# Patient Record
Sex: Male | Born: 1955 | Race: White | Hispanic: No | Marital: Married | Smoking: Never smoker
Health system: Southern US, Community
[De-identification: ages and names within clinical notes are randomized; demographics above are authoritative.]

## PROBLEM LIST (undated history)

## (undated) DIAGNOSIS — N529 Male erectile dysfunction, unspecified: Secondary | ICD-10-CM

## (undated) DIAGNOSIS — I509 Heart failure, unspecified: Secondary | ICD-10-CM

## (undated) DIAGNOSIS — J310 Chronic rhinitis: Secondary | ICD-10-CM

## (undated) DIAGNOSIS — K219 Gastro-esophageal reflux disease without esophagitis: Secondary | ICD-10-CM

## (undated) DIAGNOSIS — F32A Depression, unspecified: Secondary | ICD-10-CM

## (undated) DIAGNOSIS — I10 Essential (primary) hypertension: Secondary | ICD-10-CM

## (undated) DIAGNOSIS — C61 Malignant neoplasm of prostate: Secondary | ICD-10-CM

## (undated) DIAGNOSIS — F419 Anxiety disorder, unspecified: Secondary | ICD-10-CM

## (undated) HISTORY — DX: Gastro-esophageal reflux disease without esophagitis: K21.9

## (undated) HISTORY — DX: Malignant neoplasm of prostate: C61

## (undated) HISTORY — DX: Heart failure, unspecified: I50.9

## (undated) HISTORY — DX: Male erectile dysfunction, unspecified: N52.9

## (undated) HISTORY — DX: Depression, unspecified: F32.A

---

## 2014-11-17 HISTORY — PX: PROSTATECTOMY: SHX69

## 2021-02-20 DIAGNOSIS — I361 Nonrheumatic tricuspid (valve) insufficiency: Secondary | ICD-10-CM | POA: Diagnosis not present

## 2021-02-20 DIAGNOSIS — I509 Heart failure, unspecified: Secondary | ICD-10-CM | POA: Diagnosis not present

## 2021-02-20 DIAGNOSIS — I340 Nonrheumatic mitral (valve) insufficiency: Secondary | ICD-10-CM | POA: Diagnosis not present

## 2021-02-21 DIAGNOSIS — I509 Heart failure, unspecified: Secondary | ICD-10-CM | POA: Diagnosis not present

## 2021-02-21 DIAGNOSIS — I10 Essential (primary) hypertension: Secondary | ICD-10-CM | POA: Diagnosis not present

## 2021-02-21 DIAGNOSIS — I720 Aneurysm of carotid artery: Secondary | ICD-10-CM | POA: Diagnosis not present

## 2021-02-22 ENCOUNTER — Encounter (HOSPITAL_COMMUNITY)
Admission: EM | Disposition: A | Discharge status: Home / Self Care | Payer: Self-pay | Source: Other Acute Inpatient Hospital | Attending: Internal Medicine

## 2021-02-22 ENCOUNTER — Inpatient Hospital Stay (HOSPITAL_COMMUNITY)
Admission: EM | Admit: 2021-02-22 | Discharge: 2021-02-25 | DRG: 286 | Disposition: A | Discharge status: Home / Self Care | Payer: Medicare Other | Source: Other Acute Inpatient Hospital | Attending: Internal Medicine | Admitting: Internal Medicine

## 2021-02-22 ENCOUNTER — Encounter (HOSPITAL_COMMUNITY): Payer: Self-pay | Admitting: Internal Medicine

## 2021-02-22 DIAGNOSIS — F32A Depression, unspecified: Secondary | ICD-10-CM | POA: Diagnosis present

## 2021-02-22 DIAGNOSIS — Z8249 Family history of ischemic heart disease and other diseases of the circulatory system: Secondary | ICD-10-CM | POA: Diagnosis not present

## 2021-02-22 DIAGNOSIS — I428 Other cardiomyopathies: Secondary | ICD-10-CM | POA: Diagnosis present

## 2021-02-22 DIAGNOSIS — Z20822 Contact with and (suspected) exposure to covid-19: Secondary | ICD-10-CM | POA: Diagnosis present

## 2021-02-22 DIAGNOSIS — J310 Chronic rhinitis: Secondary | ICD-10-CM | POA: Diagnosis present

## 2021-02-22 DIAGNOSIS — Z79899 Other long term (current) drug therapy: Secondary | ICD-10-CM | POA: Diagnosis not present

## 2021-02-22 DIAGNOSIS — F419 Anxiety disorder, unspecified: Secondary | ICD-10-CM | POA: Diagnosis present

## 2021-02-22 DIAGNOSIS — I2510 Atherosclerotic heart disease of native coronary artery without angina pectoris: Secondary | ICD-10-CM

## 2021-02-22 DIAGNOSIS — I493 Ventricular premature depolarization: Secondary | ICD-10-CM | POA: Diagnosis present

## 2021-02-22 DIAGNOSIS — I110 Hypertensive heart disease with heart failure: Secondary | ICD-10-CM | POA: Diagnosis present

## 2021-02-22 DIAGNOSIS — Z8546 Personal history of malignant neoplasm of prostate: Secondary | ICD-10-CM | POA: Diagnosis not present

## 2021-02-22 DIAGNOSIS — I10 Essential (primary) hypertension: Secondary | ICD-10-CM

## 2021-02-22 DIAGNOSIS — I5021 Acute systolic (congestive) heart failure: Secondary | ICD-10-CM | POA: Diagnosis present

## 2021-02-22 DIAGNOSIS — R000 Tachycardia, unspecified: Secondary | ICD-10-CM

## 2021-02-22 HISTORY — DX: Essential (primary) hypertension: I10

## 2021-02-22 HISTORY — DX: Chronic rhinitis: J31.0

## 2021-02-22 HISTORY — DX: Anxiety disorder, unspecified: F41.9

## 2021-02-22 HISTORY — PX: RIGHT/LEFT HEART CATH AND CORONARY ANGIOGRAPHY: CATH118266

## 2021-02-22 LAB — POCT I-STAT EG7
Acid-Base Excess: 1 mmol/L (ref 0.0–2.0)
Acid-Base Excess: 2 mmol/L (ref 0.0–2.0)
Acid-Base Excess: 2 mmol/L (ref 0.0–2.0)
Bicarbonate: 25.9 mmol/L (ref 20.0–28.0)
Bicarbonate: 26.5 mmol/L (ref 20.0–28.0)
Bicarbonate: 26.5 mmol/L (ref 20.0–28.0)
Calcium, Ion: 1.1 mmol/L — ABNORMAL LOW (ref 1.15–1.40)
Calcium, Ion: 1.12 mmol/L — ABNORMAL LOW (ref 1.15–1.40)
Calcium, Ion: 1.15 mmol/L (ref 1.15–1.40)
HCT: 48 % (ref 39.0–52.0)
HCT: 48 % (ref 39.0–52.0)
HCT: 49 % (ref 39.0–52.0)
Hemoglobin: 16.3 g/dL (ref 13.0–17.0)
Hemoglobin: 16.3 g/dL (ref 13.0–17.0)
Hemoglobin: 16.7 g/dL (ref 13.0–17.0)
O2 Saturation: 74 %
O2 Saturation: 75 %
O2 Saturation: 81 %
Potassium: 3.6 mmol/L (ref 3.5–5.1)
Potassium: 3.7 mmol/L (ref 3.5–5.1)
Potassium: 3.7 mmol/L (ref 3.5–5.1)
Sodium: 136 mmol/L (ref 135–145)
Sodium: 137 mmol/L (ref 135–145)
Sodium: 137 mmol/L (ref 135–145)
TCO2: 27 mmol/L (ref 22–32)
TCO2: 28 mmol/L (ref 22–32)
TCO2: 28 mmol/L (ref 22–32)
pCO2, Ven: 41 mmHg — ABNORMAL LOW (ref 44.0–60.0)
pCO2, Ven: 41.2 mmHg — ABNORMAL LOW (ref 44.0–60.0)
pCO2, Ven: 41.5 mmHg — ABNORMAL LOW (ref 44.0–60.0)
pH, Ven: 7.408 (ref 7.250–7.430)
pH, Ven: 7.413 (ref 7.250–7.430)
pH, Ven: 7.417 (ref 7.250–7.430)
pO2, Ven: 39 mmHg (ref 32.0–45.0)
pO2, Ven: 39 mmHg (ref 32.0–45.0)
pO2, Ven: 45 mmHg (ref 32.0–45.0)

## 2021-02-22 LAB — POCT I-STAT 7, (LYTES, BLD GAS, ICA,H+H)
Acid-base deficit: 1 mmol/L (ref 0.0–2.0)
Bicarbonate: 21.9 mmol/L (ref 20.0–28.0)
Calcium, Ion: 0.91 mmol/L — ABNORMAL LOW (ref 1.15–1.40)
HCT: 43 % (ref 39.0–52.0)
Hemoglobin: 14.6 g/dL (ref 13.0–17.0)
O2 Saturation: 99 %
Potassium: 3.1 mmol/L — ABNORMAL LOW (ref 3.5–5.1)
Sodium: 142 mmol/L (ref 135–145)
TCO2: 23 mmol/L (ref 22–32)
pCO2 arterial: 32.2 mmHg (ref 32.0–48.0)
pH, Arterial: 7.441 (ref 7.350–7.450)
pO2, Arterial: 109 mmHg — ABNORMAL HIGH (ref 83.0–108.0)

## 2021-02-22 SURGERY — RIGHT/LEFT HEART CATH AND CORONARY ANGIOGRAPHY
Anesthesia: LOCAL

## 2021-02-22 MED ORDER — ASPIRIN 81 MG PO CHEW: 81.0000 mg | CHEWABLE_TABLET | ORAL | Status: DC

## 2021-02-22 MED ORDER — ROSUVASTATIN CALCIUM 5 MG PO TABS
10.0000 mg | ORAL_TABLET | Freq: Every day | ORAL | Status: DC
  Administered 2021-02-23 – 2021-02-25 (×3): 10 mg via ORAL
  Filled 2021-02-22 (×3): qty 2

## 2021-02-22 MED ORDER — LIDOCAINE HCL (PF) 1 % IJ SOLN
INTRAMUSCULAR | Status: AC
  Filled 2021-02-22: qty 30

## 2021-02-22 MED ORDER — HEPARIN SODIUM (PORCINE) 1000 UNIT/ML IJ SOLN
INTRAMUSCULAR | Status: AC
  Filled 2021-02-22: qty 1

## 2021-02-22 MED ORDER — SODIUM CHLORIDE 0.9 % IV SOLN: INTRAVENOUS | Status: DC

## 2021-02-22 MED ORDER — FENTANYL CITRATE (PF) 100 MCG/2ML IJ SOLN
INTRAMUSCULAR | Status: AC
  Filled 2021-02-22: qty 2

## 2021-02-22 MED ORDER — ENOXAPARIN SODIUM 40 MG/0.4ML ~~LOC~~ SOLN
40.0000 mg | SUBCUTANEOUS | Status: DC
  Administered 2021-02-23 – 2021-02-25 (×3): 40 mg via SUBCUTANEOUS
  Filled 2021-02-22 (×3): qty 0.4

## 2021-02-22 MED ORDER — MIDAZOLAM HCL 2 MG/2ML IJ SOLN
INTRAMUSCULAR | Status: DC | PRN
  Administered 2021-02-22: 1 mg via INTRAVENOUS

## 2021-02-22 MED ORDER — MIDAZOLAM HCL 2 MG/2ML IJ SOLN
INTRAMUSCULAR | Status: AC
  Filled 2021-02-22: qty 2

## 2021-02-22 MED ORDER — HEPARIN SODIUM (PORCINE) 1000 UNIT/ML IJ SOLN
INTRAMUSCULAR | Status: DC | PRN
  Administered 2021-02-22: 5000 [IU] via INTRAVENOUS

## 2021-02-22 MED ORDER — HEPARIN (PORCINE) IN NACL 1000-0.9 UT/500ML-% IV SOLN
INTRAVENOUS | Status: AC
  Filled 2021-02-22: qty 1000

## 2021-02-22 MED ORDER — SODIUM CHLORIDE 0.9% FLUSH
3.0000 mL | Freq: Two times a day (BID) | INTRAVENOUS | Status: DC
  Administered 2021-02-22 – 2021-02-25 (×6): 3 mL via INTRAVENOUS

## 2021-02-22 MED ORDER — SODIUM CHLORIDE 0.9 % IV SOLN: 250.0000 mL | INTRAVENOUS | Status: DC | PRN

## 2021-02-22 MED ORDER — VERAPAMIL HCL 2.5 MG/ML IV SOLN
INTRAVENOUS | Status: DC | PRN
  Administered 2021-02-22: 10 mL via INTRA_ARTERIAL

## 2021-02-22 MED ORDER — IOHEXOL 350 MG/ML SOLN
INTRAVENOUS | Status: DC | PRN
  Administered 2021-02-22: 40 mL

## 2021-02-22 MED ORDER — FENTANYL CITRATE (PF) 100 MCG/2ML IJ SOLN
INTRAMUSCULAR | Status: DC | PRN
  Administered 2021-02-22: 25 ug via INTRAVENOUS

## 2021-02-22 MED ORDER — HEPARIN (PORCINE) IN NACL 1000-0.9 UT/500ML-% IV SOLN
INTRAVENOUS | Status: DC | PRN
  Administered 2021-02-22 (×2): 500 mL

## 2021-02-22 MED ORDER — SPIRONOLACTONE 12.5 MG HALF TABLET
12.5000 mg | ORAL_TABLET | Freq: Every day | ORAL | Status: DC
  Administered 2021-02-22 – 2021-02-25 (×4): 12.5 mg via ORAL
  Filled 2021-02-22 (×4): qty 1

## 2021-02-22 MED ORDER — DIGOXIN 125 MCG PO TABS
0.1250 mg | ORAL_TABLET | Freq: Every day | ORAL | Status: DC
  Administered 2021-02-22 – 2021-02-25 (×4): 0.125 mg via ORAL
  Filled 2021-02-22 (×4): qty 1

## 2021-02-22 MED ORDER — SODIUM CHLORIDE 0.9% FLUSH: 3.0000 mL | INTRAVENOUS | Status: DC | PRN

## 2021-02-22 MED ORDER — VERAPAMIL HCL 2.5 MG/ML IV SOLN
INTRAVENOUS | Status: AC
  Filled 2021-02-22: qty 2

## 2021-02-22 MED ORDER — LIDOCAINE HCL (PF) 1 % IJ SOLN
INTRAMUSCULAR | Status: DC | PRN
  Administered 2021-02-22 (×2): 2 mL

## 2021-02-22 SURGICAL SUPPLY — 10 items
CATH 5FR JL3.5 JR4 ANG PIG MP (CATHETERS) ×2 IMPLANT
CATH SWAN GANZ 7F STRAIGHT (CATHETERS) ×2 IMPLANT
DEVICE RAD COMP TR BAND LRG (VASCULAR PRODUCTS) ×2 IMPLANT
GLIDESHEATH SLEND SS 6F .021 (SHEATH) ×2 IMPLANT
GLIDESHEATH SLENDER 7FR .021G (SHEATH) ×2 IMPLANT
GUIDEWIRE INQWIRE 1.5J.035X260 (WIRE) ×1 IMPLANT
INQWIRE 1.5J .035X260CM (WIRE) ×2
KIT HEART LEFT (KITS) ×2 IMPLANT
PACK CARDIAC CATHETERIZATION (CUSTOM PROCEDURE TRAY) ×2 IMPLANT
TRANSDUCER W/STOPCOCK (MISCELLANEOUS) ×2 IMPLANT

## 2021-02-22 NOTE — Interval H&P Note (Signed)
History and Physical Interval Note:  02/22/2021 2:12 PM  Neil Weber  has presented today for surgery, with the diagnosis of acute systolic HF.  The various methods of treatment have been discussed with the patient and family. After consideration of risks, benefits and other options for treatment, the patient has consented to  Procedure(s): RIGHT/LEFT HEART CATH AND CORONARY ANGIOGRAPHY (N/A) and possible coronary angioplasty as a surgical intervention.  The patient's history has been reviewed, patient examined, no change in status, stable for surgery.  I have reviewed the patient's chart and labs.  Questions were answered to the patient's satisfaction.     Madisan Bice

## 2021-02-22 NOTE — Progress Notes (Signed)
PT ARRIVED [facility]. PT ALERT AND ORIENTED X'S 4. PT IS PAIN FREE, DENIES SOB. PT HAS IV SALINE LOCK TO RIGHT AC.

## 2021-02-22 NOTE — H&P (Signed)
Advanced Heart Failure Team History and Physical Note   PCP:  No primary care provider on file.  PCP-Cardiology: No primary care provider on file.     Reason for Admission: Acute Systolic Heart Failure.    HPI:   Mr Halling is a 65 year old with history of anxiety, depression, and prostate cancer. No family history of coronary disease.   ON 4/6 he went to PCP due to worsening shortness of breath which was new for him. He was sent to [facility] for evaluation. He first noticed some shortness of breath earlier this week and thought that it was from allergies.  No recent virus. Had CTA which was negative for PE and showed moderate bilateral pleural effusions. EKG on admit with ST at 131 bpm. Started on IV lasix and brisk diuresis. Weight down 3 pounds in 24 hours.  ECHO completed and showed reduced LVEF 20-25%. Cardiology consulted with recommendations to transfer to [facility].   Labs 02/20/21 - creatinine 1.2 K 4.3 TSH `.65  Trop < 0.04 UDS negative  Respiratory Pane neg for flu and SARS 2 Labs 02/21/21- creatinine 1.1, K 4, Hgb 14.5  Labs 02/22/21- Creatinine 1.3   Transferred to [facility] for diagnostic cath. Denies chest pain.    FH: No known family history for coronary disease.  SH: He does not drink alcohol or smoke. 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 Review of Systems: [y] = yes, [ ]  = no   . General: Weight gain [ ] ; Weight loss [ ] ; Anorexia [ ] ; Fatigue [Y ]; Fever [ ] ; Chills [ ] ; Weakness [ ]   . Cardiac: Chest pain/pressure [ ] ; Resting SOB [ ] ; Exertional SOB [Y ]; Orthopnea [ ] ; Pedal Edema [ ] ; Palpitations [ ] ; Syncope [ ] ; Presyncope [ ] ; Paroxysmal nocturnal dyspnea[ ]   . Pulmonary: Cough [ ] ; Wheezing[ ] ; Hemoptysis[ ] ; Sputum [ ] ; Snoring [ ]   . GI: Vomiting[ ] ; Dysphagia[ ] ; Melena[ ] ; Hematochezia [ ] ; Heartburn[ ] ; Abdominal pain [ ] ; Constipation [ ] ; Diarrhea [ ] ; BRBPR [ ]   . GU: Hematuria[ ] ; Dysuria [ ] ; Nocturia[ ]    . Vascular: Pain in legs with walking [ ] ; Pain in feet with lying flat [ ] ; Non-healing sores [ ] ; Stroke [ ] ; TIA [ ] ; Slurred speech [ ] ;  . Neuro: Headaches[ ] ; Vertigo[ ] ; Seizures[ ] ; Paresthesias[ ] ;Blurred vision [ ] ; Diplopia [ ] ; Vision changes [ ]   . Ortho/Skin: Arthritis [ ] ; Joint pain [ ] ; Muscle pain [ ] ; Joint swelling [ ] ; Back Pain [ ] ; Rash [ ]   . Psych: Depression[ ] ; Anxiety[Y ]  . Heme: Bleeding problems [ ] ; Clotting disorders [ ] ; Anemia [ ]   . Endocrine: Diabetes [ ] ; Thyroid dysfunction[ ]    Home Medications Prior to Admission medications   Not on File    Past Medical History: Past Medical History:  Diagnosis Date  . Anxiety   . Hypertension   . Rhinitis     Past Surgical History:  Family History:  Family History  Problem Relation Age of Onset  . Hypertension Father     Social History: Social History   Socioeconomic History  . Marital status: Married    Spouse name: Not on file  . Number of children: Not on file  . Years of education: Not on file  . Highest education level: Not on file  Occupational History    Employer: [employer]  Comment: Full Time   Tobacco Use  . Smoking status: Never Smoker  . Smokeless tobacco: Never Used  Substance and Sexual Activity  . Alcohol use: Not Currently  . Drug use: Never  . Sexual activity: Not on file  Other Topics Concern  . Not on file  Social History Narrative  . Not on file   Social Determinants of Health   Financial Resource Strain: Not on file  Food Insecurity: Not on file  Transportation Needs: Not on file  Physical Activity: Not on file  Stress: Not on file  Social Connections: Not on file    Allergies:  No Known Allergies  Objective:    Vital Signs:   Weight:  [105 kg] 105 kg (04/08 1400)   Filed Weights   02/22/21 1400  Weight: 105 kg     Physical Exam     General:   No respiratory difficulty HEENT: Normal Neck: Supple. no JVD. Carotids 2+ bilat; no bruits.  No lymphadenopathy or thyromegaly appreciated. Cor: PMI nondisplaced. Regular rate & rhythm. No rubs, gallops or murmurs. Lungs: Clear Abdomen: Soft, nontender, nondistended. No hepatosplenomegaly. No bruits or masses. Good bowel sounds. Extremities: No cyanosis, clubbing, rash, edema Neuro: Alert & oriented x 3, cranial nerves grossly intact. moves all 4 extremities w/o difficulty. Affect pleasant.   Telemetry   Sinus Tach 100s   EKG  N/a   Labs     Basic Metabolic Panel: No results for input(s): NA, K, CL, CO2, GLUCOSE, BUN, CREATININE, CALCIUM, MG, PHOS in the last 168 hours.  Liver Function Tests: No results for input(s): AST, ALT, ALKPHOS, BILITOT, PROT, ALBUMIN in the last 168 hours. No results for input(s): LIPASE, AMYLASE in the last 168 hours. No results for input(s): AMMONIA in the last 168 hours.  CBC: No results for input(s): WBC, NEUTROABS, HGB, HCT, MCV, PLT in the last 168 hours.  Cardiac Enzymes: No results for input(s): CKTOTAL, CKMB, CKMBINDEX, TROPONINI in the last 168 hours.  BNP: BNP (last 3 results) No results for input(s): BNP in the last 8760 hours.  ProBNP (last 3 results) No results for input(s): PROBNP in the last 8760 hours.   CBG: No results for input(s): GLUCAP in the last 168 hours.  Coagulation Studies: No results for input(s): LABPROT, INR in the last 72 hours.  Imaging: No results found.     Assessment/Plan   1. Acute Systolic Heart Failure ECHO at [facility] EF 20-25% RV Mildly reduced. Unknown etiology. Hypertensive on admit. Troponin < 0.04. Diagnostic cath today to further assess.  Creatinine today 1.3  - If coronaries ok will need CMRI . -Adjust HF meds post cath.  - Renal function has been stable.   2. HTN  Hypertensive at [facility].  Follow closely.   3. Sinus Tach  Possible low output put. 4/6 TSH 1.65   Darrick Grinder, NP 02/22/2021, 2:16 PM  Advanced Heart Failure Team Pager [phone] (M-F; 7a - 5p)   Please contact [facility] for night-coverage after hours (4p -7a ) and weekends on [email or web address]  Patient seen and examined with the above-signed Advanced Practice Provider and/or Housestaff. I personally reviewed laboratory data, imaging studies and relevant notes. I independently examined the patient and formulated the important aspects of the plan. I have edited the note to reflect any of my changes or salient points. I have personally discussed the plan with the patient and/or family.  65 y/o male with h/o anxiety and depression admitted to [place] with new onset HF  Echo EF 25%. Has been diuresed and feels better.   General:  Well appearing. No resp difficulty HEENT: normal Neck: supple. no JVD. Carotids 2+ bilat; no bruits. No lymphadenopathy or thryomegaly appreciated. Cor: PMI nondisplaced. Tachy regular  +s3 Lungs: clear Abdomen: soft, nontender, nondistended. No hepatosplenomegaly. No bruits or masses. Good bowel sounds. Extremities: no cyanosis, clubbing, rash, edema Neuro: alert & orientedx3, cranial nerves grossly intact. moves all 4 extremities w/o difficulty. Affect pleasant  New onset systolic HF in patient with no significan CV risk factors. Suspect NICM. Plan R/: cath today followed by cMRI. Titrate GDMT.   Glori Bickers, MD  2:23 PM

## 2021-02-22 NOTE — H&P (Addendum)
Advanced Heart Failure Team History and Physical Note   PCP:  No primary care provider on file.  PCP-Cardiology: No primary care provider on file.     Reason for Admission: Acute Systolic Heart Failure.    HPI:   Neil Weber is a 65 year old with history of anxiety, depression, and prostate cancer. No family history of coronary disease.   ON 4/6 he went to PCP due to worsening shortness of breath which was new for him. He was sent to [facility] for evaluation. He first noticed some shortness of breath earlier this week and thought that it was from allergies.  No recent virus. Had CTA which was negative for PE and showed moderate bilateral pleural effusions. EKG on admit with ST at 131 bpm. Started on IV lasix and brisk diuresis. Weight down 3 pounds in 24 hours.  ECHO completed and showed reduced LVEF 20-25%. Cardiology consulted with recommendations to transfer to [facility].   Labs 02/20/21 - creatinine 1.2 K 4.3 TSH `.65  Trop < 0.04 UDS negative  Respiratory Pane neg for flu and SARS 2 Labs 02/21/21- creatinine 1.1, K 4, Hgb 14.5  Labs 02/22/21- Creatinine 1.3   Transferred to [facility] for diagnostic cath. Denies chest pain.    FH: No known family history for coronary disease.  SH: He does not drink alcohol or smoke. 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 Review of Systems: [y] = yes, [ ]  = no   . General: Weight gain [ ] ; Weight loss [ ] ; Anorexia [ ] ; Fatigue [Y ]; Fever [ ] ; Chills [ ] ; Weakness [ ]   . Cardiac: Chest pain/pressure [ ] ; Resting SOB [ ] ; Exertional SOB [Y ]; Orthopnea [ ] ; Pedal Edema [ ] ; Palpitations [ ] ; Syncope [ ] ; Presyncope [ ] ; Paroxysmal nocturnal dyspnea[ ]   . Pulmonary: Cough [ ] ; Wheezing[ ] ; Hemoptysis[ ] ; Sputum [ ] ; Snoring [ ]   . GI: Vomiting[ ] ; Dysphagia[ ] ; Melena[ ] ; Hematochezia [ ] ; Heartburn[ ] ; Abdominal pain [ ] ; Constipation [ ] ; Diarrhea [ ] ; BRBPR [ ]   . GU: Hematuria[ ] ; Dysuria [ ] ; Nocturia[ ]    . Vascular: Pain in legs with walking [ ] ; Pain in feet with lying flat [ ] ; Non-healing sores [ ] ; Stroke [ ] ; TIA [ ] ; Slurred speech [ ] ;  . Neuro: Headaches[ ] ; Vertigo[ ] ; Seizures[ ] ; Paresthesias[ ] ;Blurred vision [ ] ; Diplopia [ ] ; Vision changes [ ]   . Ortho/Skin: Arthritis [ ] ; Joint pain [ ] ; Muscle pain [ ] ; Joint swelling [ ] ; Back Pain [ ] ; Rash [ ]   . Psych: Depression[ ] ; Anxiety[Y ]  . Heme: Bleeding problems [ ] ; Clotting disorders [ ] ; Anemia [ ]   . Endocrine: Diabetes [ ] ; Thyroid dysfunction[ ]    Home Medications Prior to Admission medications   Not on File    Past Medical History: Past Medical History:  Diagnosis Date  . Anxiety   . Hypertension   . Rhinitis     Past Surgical History:  Family History:  Family History  Problem Relation Age of Onset  . Hypertension Father     Social History: Social History   Socioeconomic History  . Marital status: Married    Spouse name: Not on file  . Number of children: Not on file  . Years of education: Not on file  . Highest education level: Not on file  Occupational History    Employer: [employer]  Comment: Full Time   Tobacco Use  . Smoking status: Never Smoker  . Smokeless tobacco: Never Used  Substance and Sexual Activity  . Alcohol use: Not Currently  . Drug use: Never  . Sexual activity: Not on file  Other Topics Concern  . Not on file  Social History Narrative  . Not on file   Social Determinants of Health   Financial Resource Strain: Not on file  Food Insecurity: Not on file  Transportation Needs: Not on file  Physical Activity: Not on file  Stress: Not on file  Social Connections: Not on file    Allergies:  Not on File  Objective:    Vital Signs:       There were no vitals filed for this visit.   Physical Exam     General:   No respiratory difficulty HEENT: Normal Neck: Supple. no JVD. Carotids 2+ bilat; no bruits. No lymphadenopathy or thyromegaly  appreciated. Cor: PMI nondisplaced. Regular rate & rhythm. No rubs, gallops or murmurs. Lungs: Clear Abdomen: Soft, nontender, nondistended. No hepatosplenomegaly. No bruits or masses. Good bowel sounds. Extremities: No cyanosis, clubbing, rash, edema Neuro: Alert & oriented x 3, cranial nerves grossly intact. moves all 4 extremities w/o difficulty. Affect pleasant.   Telemetry   Sinus Tach 100s   EKG  N/a   Labs     Basic Metabolic Panel: No results for input(s): NA, K, CL, CO2, GLUCOSE, BUN, CREATININE, CALCIUM, MG, PHOS in the last 168 hours.  Liver Function Tests: No results for input(s): AST, ALT, ALKPHOS, BILITOT, PROT, ALBUMIN in the last 168 hours. No results for input(s): LIPASE, AMYLASE in the last 168 hours. No results for input(s): AMMONIA in the last 168 hours.  CBC: No results for input(s): WBC, NEUTROABS, HGB, HCT, MCV, PLT in the last 168 hours.  Cardiac Enzymes: No results for input(s): CKTOTAL, CKMB, CKMBINDEX, TROPONINI in the last 168 hours.  BNP: BNP (last 3 results) No results for input(s): BNP in the last 8760 hours.  ProBNP (last 3 results) No results for input(s): PROBNP in the last 8760 hours.   CBG: No results for input(s): GLUCAP in the last 168 hours.  Coagulation Studies: No results for input(s): LABPROT, INR in the last 72 hours.  Imaging: No results found.     Assessment/Plan   1. Acute Systolic Heart Failure ECHO at [facility] EF 20-25% RV Mildly reduced. Unknown etiology. Hypertensive on admit. Troponin < 0.04. Diagnostic cath today to further assess.  Creatinine today 1.3  - If coronaries ok will need CMRI . -Adjust HF meds post cath.  - Renal function has been stable.   2. HTN  Hypertensive at [facility].  Follow closely.   3. Sinus Tach  Possible low output put. 4/6 TSH 1.65    Neil Ardis, NP 02/22/2021, 1:50 PM  Advanced Heart Failure Team Pager [phone] (M-F; 7a - 5p)  Please contact [facility]  for night-coverage after hours (4p -7a ) and weekends on [email or web address]

## 2021-02-22 NOTE — Plan of Care (Signed)

## 2021-02-23 DIAGNOSIS — I5021 Acute systolic (congestive) heart failure: Secondary | ICD-10-CM

## 2021-02-23 LAB — CBC
HCT: 46.6 % (ref 39.0–52.0)
Hemoglobin: 15.3 g/dL (ref 13.0–17.0)
MCH: 28 pg (ref 26.0–34.0)
MCHC: 32.8 g/dL (ref 30.0–36.0)
MCV: 85.2 fL (ref 80.0–100.0)
Platelets: 257 10*3/uL (ref 150–400)
RBC: 5.47 MIL/uL (ref 4.22–5.81)
RDW: 13.2 % (ref 11.5–15.5)
WBC: 8.5 10*3/uL (ref 4.0–10.5)
nRBC: 0 % (ref 0.0–0.2)

## 2021-02-23 LAB — BASIC METABOLIC PANEL
Anion gap: 11 (ref 5–15)
BUN: 21 mg/dL (ref 8–23)
CO2: 22 mmol/L (ref 22–32)
Calcium: 8.8 mg/dL — ABNORMAL LOW (ref 8.9–10.3)
Chloride: 101 mmol/L (ref 98–111)
Creatinine, Ser: 1.08 mg/dL (ref 0.61–1.24)
GFR, Estimated: >60 mL/min (ref >60)
Glucose, Bld: 96 mg/dL (ref 70–99)
Potassium: 3.5 mmol/L (ref 3.5–5.1)
Sodium: 134 mmol/L — ABNORMAL LOW (ref 135–145)

## 2021-02-23 LAB — MAGNESIUM: Magnesium: 2.3 mg/dL (ref 1.7–2.4)

## 2021-02-23 MED ORDER — SACUBITRIL-VALSARTAN 24-26 MG PO TABS
1.0000 | ORAL_TABLET | Freq: Two times a day (BID) | ORAL | Status: DC
  Administered 2021-02-23 – 2021-02-25 (×5): 1 via ORAL
  Filled 2021-02-23 (×5): qty 1

## 2021-02-23 MED ORDER — CLONAZEPAM 0.5 MG PO TABS
1.0000 mg | ORAL_TABLET | Freq: Every day | ORAL | Status: DC
  Administered 2021-02-23 – 2021-02-24 (×2): 1 mg via ORAL
  Filled 2021-02-23 (×3): qty 2

## 2021-02-23 MED ORDER — DAPAGLIFLOZIN PROPANEDIOL 10 MG PO TABS
10.0000 mg | ORAL_TABLET | Freq: Every day | ORAL | Status: DC
  Administered 2021-02-23 – 2021-02-25 (×3): 10 mg via ORAL
  Filled 2021-02-23 (×3): qty 1

## 2021-02-23 NOTE — Progress Notes (Signed)
CARDIAC REHAB PHASE I   PRE:  Rate/Rhythm: 98 SR  BP:  Supine:   Sitting: 128/79  Standing:    SaO2: 96 RA  MODE:  Ambulation: 680 ft   POST:  Rate/Rhythm: 107 ST  BP:  Supine:   Sitting: 129/85  Standing:    SaO2: 96 RA 1300-1400 Pt tolerated ambulation well without c/o of pain or SOB. VS stable steady gait.Started CHF education with pt. Gave pt CHF booklet and low sodium diet information. I encouraged him to watch heart failure video on TV. Pt is very motivated to start exercising and following diet. We did discuss Outpt. CRP and he is  interested in attending.  Rodney Langton RN 02/23/2021 2:03 PM

## 2021-02-23 NOTE — Progress Notes (Signed)
TR BAND REMOVAL  LOCATION:  right radial  DEFLATED PER PROTOCOL:  Yes.    TIME BAND OFF / DRESSING APPLIED:   2130   SITE UPON ARRIVAL:   Level 0  SITE AFTER BAND REMOVAL:  Level 0  CIRCULATION SENSATION AND MOVEMENT:  Within Normal Limits  Yes.    COMMENTS:  No complications

## 2021-02-23 NOTE — Progress Notes (Signed)
Patient ID: Neil Weber, male   DOB: Dec 09, 1955, 65 y.o.   MRN: [id number]     Advanced Heart Failure Rounding Note  PCP-Cardiologist: No primary care provider on file.   Subjective:    Anxious today.  No dyspnea.  Has been up in room.    LHC/RHC:    Dist LAD lesion is 40% stenosed.   Findings:  Ao =102/71 (84) LV = 111/22 RA = 2 RV = 27/3 PA = 27/15 (21) PCW = 13 Fick cardiac output/index = 6.0/2.6 PVR = 1.2 SVR = 1086 Ao sat = 99% PA sat = 73%, 75% SVC sat = 81%  Assessment: 1. Minimal non-obstructive CAD 2. 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 Well-compensated hemodynamics.    Objective:   Weight Range: 100.2 kg Body mass index is 28.36 kg/m.   Vital Signs:   Temp:  [97.9 F (36.6 C)-98.5 F (36.9 C)] 98.4 F (36.9 C) (04/09 0800) Pulse Rate:  [88-112] 104 (04/09 0947) Resp:  [8-20] 16 (04/09 0800) BP: (94-130)/(62-91) 123/91 (04/09 0800) SpO2:  [90 %-99 %] 97 % (04/09 0800) Weight:  [100.2 kg-105 kg] 100.2 kg (04/09 0300) Last BM Date: 02/22/21  Weight change: Filed Weights   02/22/21 1400 02/23/21 0300  Weight: 105 kg 100.2 kg    Intake/Output:   Intake/Output Summary (Last 24 hours) at 02/23/2021 1019 Last data filed at 02/23/2021 0900 Gross per 24 hour  Intake 363 ml  Output 1700 ml  Net -1337 ml      Physical Exam    General:  Well appearing. No resp difficulty HEENT: Normal Neck: Supple. JVP not elevated. Carotids 2+ bilat; no bruits. No lymphadenopathy or thyromegaly appreciated. Cor: PMI nondisplaced. Regular rate & rhythm. No rubs, gallops or murmurs. Lungs: Clear Abdomen: Soft, nontender, nondistended. No hepatosplenomegaly. No bruits or masses. Good bowel sounds. Extremities: No cyanosis, clubbing, rash, edema Neuro: Alert & orientedx3, cranial nerves grossly intact. moves all 4 extremities w/o difficulty. Affect pleasant   Telemetry   NSR 90s (personally reviewed)  Labs    CBC Recent Labs    02/22/21 1447 02/23/21 0036   WBC  --  8.5  HGB 16.3 15.3  HCT 48.0 46.6  MCV  --  85.2  PLT  --  841   Basic Metabolic Panel Recent Labs    02/22/21 1447 02/23/21 0036  NA 136 134*  K 3.7 3.5  CL  --  101  CO2  --  22  GLUCOSE  --  96  BUN  --  21  CREATININE  --  1.08  CALCIUM  --  8.8*  MG  --  2.3   Liver Function Tests No results for input(s): AST, ALT, ALKPHOS, BILITOT, PROT, ALBUMIN in the last 72 hours. No results for input(s): LIPASE, AMYLASE in the last 72 hours. Cardiac Enzymes No results for input(s): CKTOTAL, CKMB, CKMBINDEX, TROPONINI in the last 72 hours.  BNP: BNP (last 3 results) No results for input(s): BNP in the last 8760 hours.  ProBNP (last 3 results) No results for input(s): PROBNP in the last 8760 hours.   D-Dimer No results for input(s): DDIMER in the last 72 hours. Hemoglobin A1C No results for input(s): HGBA1C in the last 72 hours. Fasting Lipid Panel No results for input(s): CHOL, HDL, LDLCALC, TRIG, CHOLHDL, LDLDIRECT in the last 72 hours. Thyroid Function Tests No results for input(s): TSH, T4TOTAL, T3FREE, THYROIDAB in the last 72 hours.  Invalid input(s): FREET3  Other results:   Imaging  CARDIAC CATHETERIZATION  Result Date: 02/22/2021  Dist LAD lesion is 40% stenosed.  Findings: Ao =102/71 (84) LV = 111/22 RA = 2 RV = 27/3 PA = 27/15 (21) PCW = 13 Fick cardiac output/index = 6.0/2.6 PVR = 1.2 SVR = 1086 Ao sat = 99% PA sat = 73%, 75% SVC sat = 81% Assessment: 1. Minimal non-obstructive CAD 2. 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 Well-compensated hemodynamics. Plan/Discussion: Titrate GDMT. cMRI on Monday or as outpatient. Glori Bickers, MD 5:36 PM      Medications:     Scheduled Medications: . clonazePAM  1 mg Oral QHS  . dapagliflozin propanediol  10 mg Oral Daily  . digoxin  0.125 mg Oral Daily  . enoxaparin (LOVENOX) injection  40 mg Subcutaneous Q24H  . rosuvastatin  10 mg Oral Daily  . sacubitril-valsartan  1 tablet Oral BID  . sodium chloride  flush  3 mL Intravenous Q12H  . spironolactone  12.5 mg Oral Daily     Infusions:   PRN Medications:   Assessment/Plan   1. Acute systolic CHF: Nonischemic cardiomyopathy, uncertain etiology.  Echo with EF 20-25% RV mildly reduced. LHC minimal CAD.  Filling pressures and CO ok on RHC.  He feels good, not volume overloaded.  - Can continue digoxin.  - spironolactone 12.5 daily.  - Add Entresto 24/26 bid.  - Add dapagliflozin 10 mg daily.  - Observe on new HF meds today, ambulate in hall.  If doing ok in am, probably can go home.   - Will need cMRI for infiltrative disease/myocarditis, but would rather come back as outpatient than wait until Monday.  2. Anxiety: Takes clonazepam at home qhs, will order.   Length of Stay: 1  Loralie Champagne, MD  02/23/2021, 10:19 AM  Advanced Heart Failure Team Pager [phone] (M-F; 7a - 5p)  Please contact [facility] for night-coverage after hours (5p -7a ) and weekends on [email or web address]

## 2021-02-24 ENCOUNTER — Other Ambulatory Visit: Payer: Self-pay

## 2021-02-24 ENCOUNTER — Encounter (HOSPITAL_COMMUNITY): Payer: Self-pay | Admitting: Internal Medicine

## 2021-02-24 LAB — BASIC METABOLIC PANEL
Anion gap: 6 (ref 5–15)
BUN: 17 mg/dL (ref 8–23)
CO2: 26 mmol/L (ref 22–32)
Calcium: 8.5 mg/dL — ABNORMAL LOW (ref 8.9–10.3)
Chloride: 103 mmol/L (ref 98–111)
Creatinine, Ser: 1.2 mg/dL (ref 0.61–1.24)
GFR, Estimated: >60 mL/min (ref >60)
Glucose, Bld: 102 mg/dL — ABNORMAL HIGH (ref 70–99)
Potassium: 4.4 mmol/L (ref 3.5–5.1)
Sodium: 135 mmol/L (ref 135–145)

## 2021-02-24 NOTE — Progress Notes (Signed)
Patient ID: Neil Weber, male   DOB: 1956/03/17, 65 y.o.   MRN: [id number]     Advanced Heart Failure Rounding Note  PCP-Cardiologist: No primary care provider on file.   Subjective:    Denies CP or SOB. No orthopnea or PND.     Objective:   Weight Range: 102.1 kg Body mass index is 28.9 kg/m.   Vital Signs:   Temp:  [97.6 F (36.4 C)-98.7 F (37.1 C)] 98.7 F (37.1 C) (04/10 1146) Pulse Rate:  [82-97] 97 (04/10 1146) Resp:  [11-20] 20 (04/10 1146) BP: (101-131)/(74-89) 131/88 (04/10 1146) SpO2:  [94 %-97 %] 97 % (04/10 1146) Weight:  [102.1 kg] 102.1 kg (04/10 0300) Last BM Date: 02/22/21  Weight change: Filed Weights   02/22/21 1400 02/23/21 0300 02/24/21 0300  Weight: 105 kg 100.2 kg 102.1 kg    Intake/Output:   Intake/Output Summary (Last 24 hours) at 02/24/2021 1346 Last data filed at 02/24/2021 1125 Gross per 24 hour  Intake 480 ml  Output 2400 ml  Net -1920 ml      Physical Exam    General:  Well appearing. No resp difficulty HEENT: normal Neck: supple. no JVD. Carotids 2+ bilat; no bruits. No lymphadenopathy or thryomegaly appreciated. Cor: PMI nondisplaced. Tachy regular No rubs, gallops or murmurs. Lungs: clear Abdomen: soft, nontender, nondistended. No hepatosplenomegaly. No bruits or masses. Good bowel sounds. Extremities: no cyanosis, clubbing, rash, edema Neuro: alert & orientedx3, cranial nerves grossly intact. moves all 4 extremities w/o difficulty. Affect pleasant   Telemetry   NSR 90-110 + PVCs  (2-6 PVCs/min) (personally reviewed)  Labs    CBC Recent Labs    02/22/21 1447 02/23/21 0036  WBC  --  8.5  HGB 16.3 15.3  HCT 48.0 46.6  MCV  --  85.2  PLT  --  269   Basic Metabolic Panel Recent Labs    02/23/21 0036 02/24/21 0033  NA 134* 135  K 3.5 4.4  CL 101 103  CO2 22 26  GLUCOSE 96 102*  BUN 21 17  CREATININE 1.08 1.20  CALCIUM 8.8* 8.5*  MG 2.3  --    Liver Function Tests No results for input(s): AST, ALT,  ALKPHOS, BILITOT, PROT, ALBUMIN in the last 72 hours. No results for input(s): LIPASE, AMYLASE in the last 72 hours. Cardiac Enzymes No results for input(s): CKTOTAL, CKMB, CKMBINDEX, TROPONINI in the last 72 hours.  BNP: BNP (last 3 results) No results for input(s): BNP in the last 8760 hours.  ProBNP (last 3 results) No results for input(s): PROBNP in the last 8760 hours.   D-Dimer No results for input(s): DDIMER in the last 72 hours. Hemoglobin A1C No results for input(s): HGBA1C in the last 72 hours. Fasting Lipid Panel No results for input(s): CHOL, HDL, LDLCALC, TRIG, CHOLHDL, LDLDIRECT in the last 72 hours. Thyroid Function Tests No results for input(s): TSH, T4TOTAL, T3FREE, THYROIDAB in the last 72 hours.  Invalid input(s): FREET3  Other results:   Imaging    No results found.   Medications:     Scheduled Medications: . clonazePAM  1 mg Oral QHS  . dapagliflozin propanediol  10 mg Oral Daily  . digoxin  0.125 mg Oral Daily  . enoxaparin (LOVENOX) injection  40 mg Subcutaneous Q24H  . rosuvastatin  10 mg Oral Daily  . sacubitril-valsartan  1 tablet Oral BID  . sodium chloride flush  3 mL Intravenous Q12H  . spironolactone  12.5 mg Oral Daily  Infusions:   PRN Medications:   Assessment/Plan   1. Acute systolic CHF: Nonischemic cardiomyopathy, uncertain etiology.  Echo with EF 20-25% RV mildly reduced. LHC minimal CAD.  Filling pressures and CO ok on RHC.  He feels good, not volume overloaded any more - Continue digoxin.  - Continue spironolactone 12.5 daily.  - Continue Entresto 24/26 bid.  - Continue dapagliflozin 10 mg daily.  - Observe on new HF meds today, ambulate in hall. - Plan cMRI in am then likely can go home  2. Anxiety: Takes clonazepam at home qhs, will continue  3. PVCs: - 2-6/min on tele. Does not seem high enough to cause CM - continue to follow - will need outpatient sleep study   Length of Stay: 2  Glori Bickers, MD   02/24/2021, 1:46 PM  Advanced Heart Failure Team Pager [phone] (M-F; 7a - 5p)  Please contact [facility] for night-coverage after hours (5p -7a ) and weekends on [email or web address]

## 2021-02-24 NOTE — Plan of Care (Signed)
  Problem: Education: Goal: Knowledge of General Education information will improve Description: Including pain rating scale, medication(s)/side effects and non-pharmacologic comfort measures Outcome: Progressing   Problem: Health Behavior/Discharge Planning: Goal: Ability to manage health-related needs will improve Outcome: Progressing   Problem: Clinical Measurements: Goal: Cardiovascular complication will be avoided Outcome: Progressing   Problem: Coping: Goal: Level of anxiety will decrease Outcome: Progressing

## 2021-02-25 ENCOUNTER — Telehealth (HOSPITAL_COMMUNITY): Payer: Self-pay | Admitting: Pharmacy Technician

## 2021-02-25 ENCOUNTER — Inpatient Hospital Stay (HOSPITAL_COMMUNITY): Payer: Medicare Other

## 2021-02-25 ENCOUNTER — Other Ambulatory Visit (HOSPITAL_COMMUNITY): Payer: Self-pay

## 2021-02-25 ENCOUNTER — Encounter (HOSPITAL_COMMUNITY): Payer: Self-pay | Admitting: Internal Medicine

## 2021-02-25 DIAGNOSIS — I5021 Acute systolic (congestive) heart failure: Secondary | ICD-10-CM

## 2021-02-25 LAB — BASIC METABOLIC PANEL
Anion gap: 8 (ref 5–15)
Anion gap: 10 (ref 5–15)
BUN: 23 mg/dL (ref 8–23)
BUN: 20 mg/dL (ref 8–23)
CO2: 18 mmol/L — ABNORMAL LOW (ref 22–32)
CO2: 20 mmol/L — ABNORMAL LOW (ref 22–32)
Calcium: 8.4 mg/dL — ABNORMAL LOW (ref 8.9–10.3)
Calcium: 8.6 mg/dL — ABNORMAL LOW (ref 8.9–10.3)
Chloride: 106 mmol/L (ref 98–111)
Chloride: 105 mmol/L (ref 98–111)
Creatinine, Ser: 1.04 mg/dL (ref 0.61–1.24)
Creatinine, Ser: 1 mg/dL (ref 0.61–1.24)
GFR, Estimated: >60 mL/min (ref >60)
GFR, Estimated: >60 mL/min (ref >60)
Glucose, Bld: 118 mg/dL — ABNORMAL HIGH (ref 70–99)
Glucose, Bld: 146 mg/dL — ABNORMAL HIGH (ref 70–99)
Potassium: 3.9 mmol/L (ref 3.5–5.1)
Potassium: 3.9 mmol/L (ref 3.5–5.1)
Sodium: 132 mmol/L — ABNORMAL LOW (ref 135–145)
Sodium: 135 mmol/L (ref 135–145)

## 2021-02-25 LAB — LACTIC ACID, PLASMA: Lactic Acid, Venous: 1.5 mmol/L (ref 0.5–1.9)

## 2021-02-25 MED ORDER — FLUTICASONE PROPIONATE 50 MCG/ACT NA SUSP
1.0000 | Freq: Every day | NASAL | Status: DC
  Filled 2021-02-25: qty 16

## 2021-02-25 MED ORDER — SPIRONOLACTONE 25 MG PO TABS
12.5000 mg | ORAL_TABLET | Freq: Every day | ORAL | 6 refills | Status: DC
  Filled 2021-02-25: qty 15, 30d supply, fill #0

## 2021-02-25 MED ORDER — DAPAGLIFLOZIN PROPANEDIOL 10 MG PO TABS
10.0000 mg | ORAL_TABLET | Freq: Every day | ORAL | 6 refills | Status: DC
  Filled 2021-02-25: qty 30, 30d supply, fill #0

## 2021-02-25 MED ORDER — ROSUVASTATIN CALCIUM 10 MG PO TABS
10.0000 mg | ORAL_TABLET | Freq: Every day | ORAL | 6 refills | Status: DC
  Filled 2021-02-25: qty 30, 30d supply, fill #0

## 2021-02-25 MED ORDER — DIGOXIN 125 MCG PO TABS
0.1250 mg | ORAL_TABLET | Freq: Every day | ORAL | 6 refills | Status: DC
  Filled 2021-02-25: qty 30, 30d supply, fill #0

## 2021-02-25 MED ORDER — GADOBUTROL 1 MMOL/ML IV SOLN
10.0000 mL | Freq: Once | INTRAVENOUS | Status: AC | PRN
  Administered 2021-02-25: 10 mL via INTRAVENOUS

## 2021-02-25 MED ORDER — SACUBITRIL-VALSARTAN 24-26 MG PO TABS
1.0000 | ORAL_TABLET | Freq: Two times a day (BID) | ORAL | 6 refills | Status: DC
  Filled 2021-02-25: qty 60, 30d supply, fill #0

## 2021-02-25 NOTE — Progress Notes (Addendum)
Patient ID: Neil Weber, male   DOB: 1956/10/10, 65 y.o.   MRN: [id number]     Advanced Heart Failure Rounding Note  PCP-Cardiologist: No primary care provider on file.   Subjective:    Overall feels better. No further dyspnea.   BP low this morning, upper 57W systolic but asymptomatic. < 5 PVCs/hr on tele.  CO2 low today, 26>>18. Renal function ok, SCr down 1.20>>1.04.   Awaiting cMRI today.    Objective:   Weight Range: 97.7 kg Body mass index is 27.65 kg/m.   Vital Signs:   Temp:  [97.5 F (36.4 C)-98.7 F (37.1 C)] 98.1 F (36.7 C) (04/11 0716) Pulse Rate:  [80-97] 80 (04/11 0716) Resp:  [14-20] 18 (04/11 0458) BP: (84-131)/(63-88) 84/63 (04/11 0716) SpO2:  [95 %-97 %] 95 % (04/11 0716) Weight:  [97.7 kg] 97.7 kg (04/11 0458) Last BM Date: 02/22/21  Weight change: Filed Weights   02/23/21 0300 02/24/21 0300 02/25/21 0458  Weight: 100.2 kg 102.1 kg 97.7 kg    Intake/Output:   Intake/Output Summary (Last 24 hours) at 02/25/2021 0748 Last data filed at 02/24/2021 2233 Gross per 24 hour  Intake 480 ml  Output 950 ml  Net -470 ml      Physical Exam    General:  Well appearing, moderately obese WM. Laying in bed. No resp difficulty HEENT: normal Neck: supple. no JVD. Carotids 2+ bilat; no bruits. No lymphadenopathy or thryomegaly appreciated. Cor: PMI nondisplaced. RRR. No rubs, gallops or murmurs. Lungs: CTAB, no wheezing  Abdomen: soft, nontender, nondistended. No hepatosplenomegaly. No bruits or masses. Good bowel sounds. Extremities: no cyanosis, clubbing, rash, edema. LEs warm  Neuro: alert & orientedx3, cranial nerves grossly intact. moves all 4 extremities w/o difficulty. Affect pleasant   Telemetry   NSR 90s  < 5 PVCs/hr (personally reviewed)  Labs    CBC Recent Labs    02/22/21 1447 02/23/21 0036  WBC  --  8.5  HGB 16.3 15.3  HCT 48.0 46.6  MCV  --  85.2  PLT  --  620   Basic Metabolic Panel Recent Labs    02/23/21 0036  02/24/21 0033 02/25/21 0022  NA 134* 135 132*  K 3.5 4.4 3.9  CL 101 103 106  CO2 22 26 18*  GLUCOSE 96 102* 118*  BUN 21 17 23   CREATININE 1.08 1.20 1.04  CALCIUM 8.8* 8.5* 8.4*  MG 2.3  --   --    Liver Function Tests No results for input(s): AST, ALT, ALKPHOS, BILITOT, PROT, ALBUMIN in the last 72 hours. No results for input(s): LIPASE, AMYLASE in the last 72 hours. Cardiac Enzymes No results for input(s): CKTOTAL, CKMB, CKMBINDEX, TROPONINI in the last 72 hours.  BNP: BNP (last 3 results) No results for input(s): BNP in the last 8760 hours.  ProBNP (last 3 results) No results for input(s): PROBNP in the last 8760 hours.   D-Dimer No results for input(s): DDIMER in the last 72 hours. Hemoglobin A1C No results for input(s): HGBA1C in the last 72 hours. Fasting Lipid Panel No results for input(s): CHOL, HDL, LDLCALC, TRIG, CHOLHDL, LDLDIRECT in the last 72 hours. Thyroid Function Tests No results for input(s): TSH, T4TOTAL, T3FREE, THYROIDAB in the last 72 hours.  Invalid input(s): FREET3  Other results:   Imaging    No results found.   Medications:     Scheduled Medications: . clonazePAM  1 mg Oral QHS  . dapagliflozin propanediol  10 mg Oral Daily  . digoxin  0.125 mg Oral Daily  . enoxaparin (LOVENOX) injection  40 mg Subcutaneous Q24H  . rosuvastatin  10 mg Oral Daily  . sacubitril-valsartan  1 tablet Oral BID  . sodium chloride flush  3 mL Intravenous Q12H  . spironolactone  12.5 mg Oral Daily    Infusions:   PRN Medications:   Assessment/Plan   1. Acute systolic CHF: Nonischemic cardiomyopathy, uncertain etiology.  Echo with EF 20-25% RV mildly reduced. LHC minimal CAD.  Filling pressures and CO ok on RHC.  He feels good, not volume overloaded any more - Continue digoxin 0.125 mg daily  - Continue spironolactone 12.5 daily.  - Continue Entresto 24/26 bid. BP too soft to titrate - Continue dapagliflozin 10 mg daily.  - Observe on new HF  meds today, ambulate in hall. If BP remains too soft, may need to change Entresto to Losartan  - Plan cMRI today then likely can go home  2. Anxiety: Takes clonazepam at home qhs, will continue  3. PVCs: - < 5/hr on tele. Does not seem high enough to cause CM - continue to follow - will need outpatient sleep study   Length of Stay: [place], PA-C  02/25/2021, 7:48 AM  Advanced Heart Failure Team Pager [phone] (M-F; 7a - 5p)  Please contact [facility] for night-coverage after hours (5p -7a ) and weekends on [email or web address]  Patient seen and examined with the above-signed Advanced Practice Provider and/or Housestaff. I personally reviewed laboratory data, imaging studies and relevant notes. I independently examined the patient and formulated the important aspects of the plan. I have edited the note to reflect any of my changes or salient points. I have personally discussed the plan with the patient and/or family.  Feels good. Denies SOB, orthopnea or PND. BP soft this am but now back up. Bicarb down.  General:  Well appearing. No resp difficulty HEENT: normal Neck: supple. no JVD. Carotids 2+ bilat; no bruits. No lymphadenopathy or thryomegaly appreciated. Cor: PMI nondisplaced. Regular rate & rhythm. No rubs, gallops or murmurs. Lungs: clear Abdomen: soft, nontender, nondistended. No hepatosplenomegaly. No bruits or masses. Good bowel sounds. Extremities: no cyanosis, clubbing, rash, edema Neuro: alert & orientedx3, cranial nerves grossly intact. moves all 4 extremities w/o difficulty. Affect pleasant  Plan cMRI today. If BP stable likely can go home this afternoon. Will repeat BMET and get lactic acid.  D/c meds discussed.   Glori Bickers, MD  8:18 AM

## 2021-02-25 NOTE — Plan of Care (Signed)
  Problem: Education: Goal: Knowledge of General Education information will improve Description: Including pain rating scale, medication(s)/side effects and non-pharmacologic comfort measures Outcome: Adequate for Discharge   

## 2021-02-25 NOTE — Discharge Summary (Signed)
Advanced Heart Failure Team  Discharge Summary   Patient ID: Neil Weber MRN: [id number], DOB/AGE: 20-Nov-1955 65 y.o. 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 No family history of coronary disease.   ON 4/6 he went to PCP due to worsening shortness of breath which was new for him. He was sent to [facility] for evaluation. He first noticed some shortness of breath earlier this week and thought that it was from allergies.  No recent virus. Had CTA which was negative for PE and showed moderate bilateral pleural effusions. EKG on admit with ST at 131 bpm. Started on IV lasix and brisk diuresis and symptomatic improvement  ECHO completed and showed reduced LVEF 20-25%. RV mildly reduced. Troponin < 0.04. Cardiology consulted with recommendations to transfer to [facility].   Transferred to [facility] on 4/8 for Heart Failure consult and cardiac cath. LHC showed only minimal CAD. Filling pressures and CO ok on RHC. PVCs noted on tele, but burden not high enough to cause CM. cMRI was performed prior to d/c. GDMT initiated. HF meds provided in [facility].   He will continue to be followed closely in the HF clinic.     Discharge Weight Range: 215 lb  Discharge Vitals: Blood pressure 105/76, pulse 93, temperature 98.1 F (36.7 C), temperature source Oral, resp. rate (!) 22, height 6\' 2"  (1.88 m), weight 97.7 kg, SpO2 96 %.  Labs: Lab Results  Component Value Date   WBC 8.5 02/23/2021   HGB 15.3 02/23/2021   HCT 46.6 02/23/2021   MCV 85.2 02/23/2021   PLT 257 02/23/2021    Recent Labs  Lab 02/25/21 0841  NA 135  K 3.9  CL 105  CO2 20*  BUN  20  CREATININE 1.00  CALCIUM 8.6*  GLUCOSE 146*   No results found for: CHOL, HDL, LDLCALC, TRIG BNP (last 3 results) No results for input(s): BNP in the last 8760 hours.  ProBNP (last 3 results) No results for input(s): PROBNP in the last 8760 hours.   Diagnostic Studies/Procedures   Neil CARDIAC MORPHOLOGY W WO CONTRAST  Result Date: 02/25/2021 CLINICAL DATA:  Cardiomyopathy of uncertain etiology EXAM: CARDIAC MRI TECHNIQUE: The patient was scanned on a 1.5 Tesla GE magnet. A dedicated cardiac coil was used. Functional imaging was done using Fiesta sequences. 2,3, and 4 chamber views were done to assess for RWMA's. Modified Simpson's rule using a short axis stack was used to calculate an ejection fraction on a dedicated work Conservation officer, nature. The patient received 10 cc of Multihance. After 10 minutes inversion recovery sequences were used to assess for infiltration and scar tissue. CONTRAST:  Gadavist 10 cc FINDINGS: Limited images of the lung fields showed no gross abnormalities. Severely dilated left ventricle with normal wall thickness. No evidence for LV noncompaction. No LV thrombus noted. Diffuse LV hypokinesis, EF 16%. Normal right ventricular size, moderate systolic dysfunction with EF 30%. Mild left atrial enlargement. Normal right atrium. Mild mitral regurgitation. Trileaflet aortic valve with no significant stenosis, trivial regurgitation. On delayed enhancement imaging, there was subepicardial late gadolinium enhancement (LGE) at the basal inferoseptal RV insertion site. Measurements: LVEDV 382 mL LVSV  61 mL LVEF 16% RVEDV 168 mL RVSV 51 mL RVEF 30% ECV 36% septum IMPRESSION: 1. Severely dilated left ventricle, EF 16% with severe global hypokinesis. 2. Normal right ventricular size with moderately decreased systolic function, EF 08%. 3. Nonspecific inferior RV insertion site LGE. This suggests pressure/volume overload. 4. Mildly elevated ECV percentage in septum, not in  amyloidosis range. Dalton Mclean Electronically Signed   By: Loralie Champagne M.D.   On: 02/25/2021 15:51    Discharge Medications   Allergies as of 02/25/2021   No Known Allergies     Medication List    TAKE these medications   ALPRAZolam 0.5 MG tablet Commonly known as: XANAX Take 0.25-0.5 mg by mouth at bedtime as needed for anxiety or sleep.   busPIRone 7.5 MG tablet Commonly known as: BUSPAR Take 30 mg by mouth at bedtime.   clonazePAM 1 MG tablet Commonly known as: KLONOPIN Take 1 mg by mouth at bedtime.   digoxin 0.125 MG tablet Commonly known as: LANOXIN Take 1 tablet (0.125 mg total) by mouth daily. Start taking on: February 26, 2021   DULoxetine 30 MG capsule Commonly known as: CYMBALTA Take 90 mg by mouth at bedtime.   Entresto 24-26 MG Generic drug: sacubitril-valsartan Take 1 tablet by mouth 2 (two) times daily.   famotidine 20 MG tablet Commonly known as: PEPCID Take 20 mg by mouth at bedtime.   Farxiga 10 MG Tabs tablet Generic drug: dapagliflozin propanediol Take 1 tablet (10 mg total) by mouth daily. Start taking on: February 26, 2021   fluticasone 50 MCG/ACT nasal spray Commonly known as: FLONASE Place 2 sprays into both nostrils at bedtime.   montelukast 10 MG tablet Commonly known as: SINGULAIR Take 10 mg by mouth at bedtime.   Mucinex Maximum Strength 1200 MG Tb12 Generic drug: Guaifenesin Take 1,200 mg by mouth at bedtime.   rosuvastatin 10 MG tablet Commonly known as: CRESTOR Take 1 tablet (10 mg total) by mouth daily. Start taking on: February 26, 2021   spironolactone 25 MG tablet Commonly known as: ALDACTONE Take 1/2 tablet (12.5 mg total) by mouth daily. Start taking on: February 26, 2021   tadalafil 5 MG tablet Commonly known as: CIALIS Take 5 mg by mouth at bedtime.       Disposition   The patient will be discharged in stable condition to home. Discharge Instructions    (HEART FAILURE PATIENTS) Call MD:  Anytime you have any  of the following symptoms: 1) 3 pound weight gain in 24 hours or 5 pounds in 1 week 2) shortness of breath, with or without a dry hacking cough 3) swelling in the hands, feet or stomach 4) if you have to sleep on extra pillows at night in order to breathe.   Complete by: As directed    Amb Referral to Cardiac Rehabilitation   Complete by: As directed    Referring to [place] CRP 2   Diagnosis: Heart Failure (see criteria below if ordering Phase II)   Heart Failure Type: Chronic Systolic   After initial evaluation and assessments completed: Virtual Based Care may be provided alone or in conjunction with Phase 2 Cardiac Rehab based on patient barriers.: Yes   Diet - low sodium heart healthy   Complete by: As directed    Increase activity slowly   Complete by: As directed       Follow-up Information    [facility] Follow up on 03/01/2021.   Specialty:  Cardiology Why: [address] Located on the 1st floor of [facility].  Contact information: [address] [id number] [place] [phone]                Duration of Discharge Encounter: Greater than 35 minutes   Signed, Darrick Grinder NP-C  02/25/2021, 5:38 PM

## 2021-02-25 NOTE — Telephone Encounter (Signed)
Advanced Heart Failure Patient Advocate Encounter  Patient recently hospitalized and discharged to the [facility] clinic and started on Entresto and Farxiga. The current 30 day co-pay for each medication is $570, which is a barrier. Started an [employer] for [employer] and [employer].   Will fax in once signatures are obtained.

## 2021-02-25 NOTE — Plan of Care (Signed)
  Problem: Education: Goal: Knowledge of General Education information will improve Description: Including pain rating scale, medication(s)/side effects and non-pharmacologic comfort measures Outcome: Progressing   Problem: Health Behavior/Discharge Planning: Goal: Ability to manage health-related needs will improve Outcome: Progressing   Problem: Clinical Measurements: Goal: Ability to maintain clinical measurements within normal limits will improve Outcome: Progressing Goal: Diagnostic test results will improve Outcome: Progressing   Problem: Activity: Goal: Risk for activity intolerance will decrease Outcome: Progressing   Problem: Pain Managment: Goal: General experience of comfort will improve Outcome: Progressing   

## 2021-02-25 NOTE — Progress Notes (Signed)
Discharge instructions (including medications) discussed with and copy provided to patient/caregiver 

## 2021-02-25 NOTE — Progress Notes (Signed)
CARDIAC REHAB PHASE I   PRE:  Rate/Rhythm: 88 SR  BP:  Supine: 111/79  Sitting:   Standing:    SaO2: 97%RA  MODE:  Ambulation: 800 ft   POST:  Rate/Rhythm: 101 ST PVCs  BP:  Supine:   Sitting: 123/81  Standing:    SaO2: 97%RA 1594-5859 Pt walked 800 ft on RA with steady gait and tolerated well.  No dizziness. Reviewed importance of daily weights, signs/symptoms of CHF, 2000 mg sodium restriction and 2L FR, and gave ex ed. Pt very interested in CRP 2. Will send referral letter to [facility]. Pt voiced understanding of ed. He had been looking over CHF booklet and had watched CHF video.   Graylon Good, RN BSN  02/25/2021 9:47 AM

## 2021-02-28 ENCOUNTER — Other Ambulatory Visit (HOSPITAL_COMMUNITY): Payer: Self-pay

## 2021-03-01 ENCOUNTER — Encounter (HOSPITAL_COMMUNITY): Payer: Self-pay | Admitting: Cardiology

## 2021-03-01 ENCOUNTER — Ambulatory Visit (HOSPITAL_COMMUNITY)
Admit: 2021-03-01 | Discharge: 2021-03-01 | Disposition: A | Discharge status: Home / Self Care | Payer: Medicare Other | Attending: Adult Health | Admitting: Adult Health

## 2021-03-01 ENCOUNTER — Encounter (HOSPITAL_COMMUNITY): Payer: Self-pay

## 2021-03-01 ENCOUNTER — Other Ambulatory Visit (HOSPITAL_COMMUNITY): Payer: Self-pay

## 2021-03-01 ENCOUNTER — Other Ambulatory Visit: Payer: Self-pay

## 2021-03-01 ENCOUNTER — Telehealth (HOSPITAL_COMMUNITY): Payer: Self-pay | Admitting: Pharmacy Technician

## 2021-03-01 VITALS — BP 160/70 | HR 116 | Wt 214.2 lb

## 2021-03-01 DIAGNOSIS — I5021 Acute systolic (congestive) heart failure: Secondary | ICD-10-CM | POA: Diagnosis present

## 2021-03-01 DIAGNOSIS — I110 Hypertensive heart disease with heart failure: Secondary | ICD-10-CM | POA: Insufficient documentation

## 2021-03-01 DIAGNOSIS — I10 Essential (primary) hypertension: Secondary | ICD-10-CM | POA: Diagnosis not present

## 2021-03-01 DIAGNOSIS — I5022 Chronic systolic (congestive) heart failure: Secondary | ICD-10-CM | POA: Diagnosis not present

## 2021-03-01 DIAGNOSIS — I493 Ventricular premature depolarization: Secondary | ICD-10-CM | POA: Insufficient documentation

## 2021-03-01 DIAGNOSIS — Z8249 Family history of ischemic heart disease and other diseases of the circulatory system: Secondary | ICD-10-CM | POA: Diagnosis not present

## 2021-03-01 DIAGNOSIS — I428 Other cardiomyopathies: Secondary | ICD-10-CM | POA: Diagnosis not present

## 2021-03-01 DIAGNOSIS — F419 Anxiety disorder, unspecified: Secondary | ICD-10-CM | POA: Insufficient documentation

## 2021-03-01 DIAGNOSIS — Z79899 Other long term (current) drug therapy: Secondary | ICD-10-CM | POA: Insufficient documentation

## 2021-03-01 DIAGNOSIS — I2510 Atherosclerotic heart disease of native coronary artery without angina pectoris: Secondary | ICD-10-CM | POA: Insufficient documentation

## 2021-03-01 LAB — BASIC METABOLIC PANEL
Anion gap: 7 (ref 5–15)
BUN: 13 mg/dL (ref 8–23)
CO2: 21 mmol/L — ABNORMAL LOW (ref 22–32)
Calcium: 9.2 mg/dL (ref 8.9–10.3)
Chloride: 105 mmol/L (ref 98–111)
Creatinine, Ser: 1.2 mg/dL (ref 0.61–1.24)
GFR, Estimated: >60 mL/min (ref >60)
Glucose, Bld: 103 mg/dL — ABNORMAL HIGH (ref 70–99)
Potassium: 4.5 mmol/L (ref 3.5–5.1)
Sodium: 133 mmol/L — ABNORMAL LOW (ref 135–145)

## 2021-03-01 MED ORDER — SPIRONOLACTONE 25 MG PO TABS: 25.0000 mg | ORAL_TABLET | Freq: Every day | ORAL | 6 refills | Status: DC

## 2021-03-01 MED ORDER — CARVEDILOL 3.125 MG PO TABS: 3.1250 mg | ORAL_TABLET | Freq: Two times a day (BID) | ORAL | 11 refills | Status: DC

## 2021-03-01 NOTE — Progress Notes (Signed)
PCP:  Primary HF Cardiologist: Dr Haroldine Laws  HPI: Neil Weber is a 65 year old with history of anxiety, depression, and prostate cancer. No family history of coronary disease. Recently diagnosed HFrEF 02/2021.   ON 4/6 he went to PCP due to worsening shortness of breath which was new for him. He was sent to [facility] for evaluation. He first noticed some shortness of breath earlier this week and thought that it was from allergies. No recent virus. Had CTA which was negative for PE and showed moderate bilateral pleural effusions. EKG on admit with ST at 131 bpm. Started on IV lasix and brisk diuresis and symptomatic improvement ECHO completed and showed reduced LVEF 20-25%. RV mildly reduced. Troponin <0.04. Cardiology consulted with recommendations to transfer to [facility]. Transferred to [facility] on 4/8 for Heart Failure consult and cardiac cath. LHC showed only minimal CAD. Filling pressures and CO ok on RHC. PVCs noted on tele, but burden not high enough to cause CM. cMRI was performed prior to d/c. GDMT initiated. D/C weight 215 pounds.   Today he returns for post HF follow up with his wife. Overall feeling fine. Denies SOB/PND/Orthopnea. Appetite ok. No fever or chills. Weight at home has been 210-211 pounds. SBP at home 120s. Says he gets "white coat".  Taking all medications. Wants to go back to work.    Cardiac Studies ECHO 02/2021  EF 20-25%   RHC/LHC 02/22/21  Dist LAD lesion is 40% stenosed  Findings: Ao =102/71 (84) LV = 111/22 RA = 2 RV = 27/3 PA = 27/15 (21) PCW = 13 Fick cardiac output/index = 6.0/2.6 PVR = 1.2 SVR = 1086 Ao sat = 99% PA sat = 73%, 75% SVC sat = 81% Assessment: 1. Minimal non-obstructive CAD 2. 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 Well-compensated hemodynamics.   CMRI 02/25/21 1. Severely dilated left ventricle, EF 16% with severe global hypokinesis. 2. Normal right ventricular size with moderately decreased systolic function, EF 35%. 3.  Nonspecific inferior RV insertion site LGE. This suggests pressure/volume overload. 4. Mildly elevated ECV percentage in septum, not in amyloidosis Range.   ROS: All systems negative except as listed in HPI, PMH and Problem List.  SH:  Social History   Socioeconomic History  . Marital status: Married    Spouse name: Not on file  . Number of children: Not on file  . Years of education: Not on file  . Highest education level: Not on file  Occupational History    Employer: [employer]: Full Time   Tobacco Use  . Smoking status: Never Smoker  . Smokeless tobacco: Never Used  Substance and Sexual Activity  . Alcohol use: Not Currently  . Drug use: Never  . Sexual activity: Not on file  Other Topics Concern  . Not on file  Social History Narrative  . Not on file   Social Determinants of Health   Financial Resource Strain: Not on file  Food Insecurity: Not on file  Transportation Needs: Not on file  Physical Activity: Not on file  Stress: Not on file  Social Connections: Not on file  Intimate Partner Violence: Not on file    FH:  Family History  Problem Relation Age of Onset  . Hypertension Father     Past Medical History:  Diagnosis Date  . Anxiety   . Hypertension   . Rhinitis     Current Outpatient Medications  Medication Sig Dispense Refill  . acetaminophen (TYLENOL) 500 MG tablet  Take 500 mg by mouth every 6 (six) hours as needed.    . ALPRAZolam (XANAX) 0.5 MG tablet Take 0.25-0.5 mg by mouth at bedtime as needed for anxiety or sleep.    Marland Kitchen aspirin-acetaminophen-caffeine (EXCEDRIN MIGRAINE) 250-250-65 MG tablet Take 2 tablets by mouth every 6 (six) hours as needed for headache.    . busPIRone (BUSPAR) 7.5 MG tablet Take 30 mg by mouth at bedtime.    . clonazePAM (KLONOPIN) 1 MG tablet Take 1 mg by mouth at bedtime.    . dapagliflozin propanediol (FARXIGA) 10 MG TABS tablet Take 1 tablet (10 mg total) by mouth daily. 30 tablet 6  . digoxin  (LANOXIN) 0.125 MG tablet Take 1 tablet (0.125 mg total) by mouth daily. 30 tablet 6  . DULoxetine (CYMBALTA) 30 MG capsule Take 90 mg by mouth at bedtime.    . famotidine (PEPCID) 20 MG tablet Take 20 mg by mouth at bedtime.    . fluticasone (FLONASE) 50 MCG/ACT nasal spray Place 2 sprays into both nostrils at bedtime.    . Guaifenesin (MUCINEX MAXIMUM STRENGTH) 1200 MG TB12 Take 1,200 mg by mouth at bedtime.    Marland Kitchen ibuprofen (ADVIL) 200 MG tablet Take 200 mg by mouth every 6 (six) hours as needed.    . montelukast (SINGULAIR) 10 MG tablet Take 10 mg by mouth at bedtime.    . polyethylene glycol (MIRALAX / GLYCOLAX) 17 g packet Take 17 g by mouth daily as needed.    . Probiotic Product (PROBIOTIC-10 ULTIMATE PO) Take by mouth.    . pseudoephedrine (SUDAFED) 30 MG tablet Take 30 mg by mouth every 6 (six) hours as needed for congestion.    . rosuvastatin (CRESTOR) 10 MG tablet Take 1 tablet (10 mg total) by mouth daily. 30 tablet 6  . sacubitril-valsartan (ENTRESTO) 24-26 MG Take 1 tablet by mouth 2 (two) times daily. 60 tablet 6  . spironolactone (ALDACTONE) 25 MG tablet Take 1/2 tablet (12.5 mg total) by mouth daily. 30 tablet 6  . tadalafil (CIALIS) 5 MG tablet Take 5 mg by mouth at bedtime.     No current facility-administered medications for this encounter.    Vitals:   03/01/21 1046  BP: (!) 160/70  Pulse: (!) 116  SpO2: 96%  Weight: 97.2 kg (214 lb 3.2 oz)   Wt Readings from Last 3 Encounters:  03/01/21 97.2 kg (214 lb 3.2 oz)  02/25/21 97.7 kg (215 lb 6.2 oz)    PHYSICAL EXAM: General:  Well appearing. No resp difficulty. Walked in the clinic HEENT: normal Neck: supple. JVP flat. Carotids 2+ bilaterally; no bruits. No lymphadenopathy or thryomegaly appreciated. Cor: PMI normal. Regular rate & rhythm. No rubs, gallops or murmurs. Lungs: clear Abdomen: soft, nontender, nondistended. No hepatosplenomegaly. No bruits or masses. Good bowel sounds. Extremities: no cyanosis,  clubbing, rash, edema Neuro: alert & orientedx3, cranial nerves grossly intact. Moves all 4 extremities w/o difficulty. Affect pleasant.   ECG: SR with occasional PVCs 108 bpm QRS 102 ms    ASSESSMENT & PLAN: 1.HFrEF: Nonischemic cardiomyopathy, uncertain etiology.  Echo with EF 20-25% RV mildly reduced. LHC minimal CAD.  Filling pressures and CO ok on RHC.  - CMRI - EF 16% RV 30%  - Plan to repeat ECHO in 3 months. --->July 2022  - NYHA II. Volume status stable.   Continue digoxin 0.125 mg daily  - Continue spironolactone 12.5 daily.  - Continue Entresto 24/26 bid. BP too soft to titrate - Continue dapagliflozin 10 mg  daily.  - Add coreg 3.125 mg twice a day.  - Check BMET   2. Anxiety: Meds per PCP  3. PVCs: -Occasional PVC on EKG today. . Recent hospitalization not felt to be high burden to explain cardiomyopathy.  -Add low coreg 3.125 mg twice a day.   will need outpatient sleep study   4. HTN  Elevated at the appointment. SBP at home 120s. Reports white coat at office visit except his PCP.   Discussed medication changes and plan to repeat ECHO after HF meds optimized. Discussed avoiding NSAIDs. Note for work provided 3 days a week with rest breaks as needed. Re-evaluate at next visit.   Check BMET   Follow up in 3 weeks with APP and 6 weeks with pharmacy. Repeat ECHO in 3 months with Dr Haroldine Laws.    Sammuel Blick NP-C  12:08 PM

## 2021-03-01 NOTE — Telephone Encounter (Signed)
After reviewing the patient's applications, he is over the income for assistance and a grant at this time. Spoke with patient and went over co-pays for [place], ($[phone]) and Farxiga [phone]).   He stated that he can afford the co-pays. Encouraged him to call for samples if he has a month or two that these co-pays strain him financially.  Charlann Boxer, CPhT

## 2021-03-01 NOTE — Patient Instructions (Addendum)
START Coreg 3.125mg , one tab twice a day START Spironolactone 25 mg, one tab daily  Labs today We will only contact you if something comes back abnormal or we need to make some changes. Otherwise no news is good news!  Your physician recommends that you schedule a follow-up appointment in: 3 weeks  in the Advanced Practitioners (PA/NP) Clinic and 6  Weeks in the pharmacy team    Do the following things EVERYDAY: 1) Weigh yourself in the morning before breakfast. Write it down and keep it in a log. 2) Take your medicines as prescribed 3) Eat low salt foods--Limit salt (sodium) to 2000 mg per day.  4) Stay as active as you can everyday 5) Limit all fluids for the day to less than 2 liters  If you have any questions or concerns before your next appointment please send [place] a message through [place] or call our office at [phone].    TO LEAVE A MESSAGE FOR THE NURSE SELECT OPTION 2, PLEASE LEAVE A MESSAGE INCLUDING: . YOUR NAME . DATE OF BIRTH . CALL BACK NUMBER . REASON FOR CALL**this is important as we prioritize the call backs  [place] AS LONG AS YOU CALL BEFORE 4:00 PM  At the [facility], you and your health needs are our priority. As part of our continuing mission to provide you with exceptional heart care, we have created designated Provider Care Teams. These Care Teams include your primary Cardiologist (physician) and Advanced Practice Providers (APPs- Physician Assistants and Nurse Practitioners) who all work together to provide you with the care you need, when you need it.   You may see any of the following providers on your designated Care Team at your next follow up: Marland Kitchen Dr Glori Bickers . Dr Loralie Champagne . Dr Vickki Muff . Darrick Grinder, NP . Lyda Jester, [place] . Audry Riles, PharmD   Please be sure to bring in all your medications bottles to every appointment.

## 2021-03-06 ENCOUNTER — Telehealth (HOSPITAL_COMMUNITY): Payer: Self-pay

## 2021-03-06 NOTE — Telephone Encounter (Signed)
Per phase I, fax cardiac rehab referral to [facility].  

## 2021-03-08 ENCOUNTER — Other Ambulatory Visit (HOSPITAL_COMMUNITY): Payer: Self-pay

## 2021-03-08 MED ORDER — DIGOXIN 125 MCG PO TABS: 0.1250 mg | ORAL_TABLET | Freq: Every day | ORAL | 3 refills | Status: DC

## 2021-03-25 ENCOUNTER — Other Ambulatory Visit (HOSPITAL_COMMUNITY): Payer: Self-pay | Admitting: *Deleted

## 2021-03-25 MED ORDER — ROSUVASTATIN CALCIUM 10 MG PO TABS: 10.0000 mg | ORAL_TABLET | Freq: Every day | ORAL | 6 refills | Status: DC

## 2021-03-27 ENCOUNTER — Other Ambulatory Visit (HOSPITAL_COMMUNITY): Payer: Self-pay

## 2021-03-28 ENCOUNTER — Other Ambulatory Visit (HOSPITAL_COMMUNITY): Payer: Self-pay

## 2021-03-28 ENCOUNTER — Other Ambulatory Visit (HOSPITAL_COMMUNITY): Payer: Self-pay | Admitting: *Deleted

## 2021-03-29 ENCOUNTER — Telehealth (HOSPITAL_COMMUNITY): Payer: Self-pay | Admitting: Pharmacy Technician

## 2021-03-29 ENCOUNTER — Other Ambulatory Visit (HOSPITAL_COMMUNITY): Payer: Self-pay

## 2021-03-29 MED ORDER — SACUBITRIL-VALSARTAN 24-26 MG PO TABS: 1.0000 | ORAL_TABLET | Freq: Two times a day (BID) | ORAL | 11 refills | Status: DC

## 2021-03-29 MED ORDER — CARVEDILOL 3.125 MG PO TABS: 3.1250 mg | ORAL_TABLET | Freq: Two times a day (BID) | ORAL | 11 refills | Status: DC

## 2021-03-29 MED ORDER — SPIRONOLACTONE 25 MG PO TABS: 25.0000 mg | ORAL_TABLET | Freq: Every day | ORAL | 11 refills | Status: DC

## 2021-03-29 NOTE — Telephone Encounter (Signed)
Advanced Heart Failure Patient Advocate Encounter  I received a PA request for Neil Weber. The request for authorization of this medication is going to be denied because the patient has not tried and failed Invokana or Jardiance. We would need to change the patient to Neil Weber and if he fails that therapy then we could proceed with trying to get Neil Weber approval. The doses the patient has already picked up of Neil Weber were a transition fill, in order to give time for a PA to be sent and approved.   The co-pay for Jardiance $387.13, the same price as one month of Farxiga, from previous notes.   Called and left the patient a message to call back, so we can go over this change.

## 2021-04-01 ENCOUNTER — Telehealth (HOSPITAL_COMMUNITY): Payer: Self-pay | Admitting: Pharmacy Technician

## 2021-04-01 NOTE — Telephone Encounter (Signed)
Advanced Heart Failure Patient Advocate Encounter  Spoke with patient today. He was agreeable to switch to Jardiance 10mg , since its preferred on his insurance. He was appreciative of the drastic difference in co-pays between the two medications.  Sent Jasmine, (Jacobus) a request to send a 90 day RX to [facility] home delivery, per the patient's request. Advised the patient to call with any issues.  Charlann Boxer, CPhT

## 2021-04-02 ENCOUNTER — Other Ambulatory Visit (HOSPITAL_COMMUNITY): Payer: Self-pay | Admitting: *Deleted

## 2021-04-02 MED ORDER — EMPAGLIFLOZIN 10 MG PO TABS: 10.0000 mg | ORAL_TABLET | Freq: Every day | ORAL | 3 refills | Status: DC

## 2021-04-03 NOTE — Progress Notes (Signed)
PCP:  Primary HF Cardiologist: Dr Haroldine Laws  HPI: Neil Weber is a 65 year old with history of anxiety, depression, and prostate cancer. No family history of coronary disease. Recently diagnosed HFrEF 02/2021.   ON 4/6 he went to PCP due to worsening shortness of breath which was new for him. He was sent to [facility] for evaluation. He first noticed some shortness of breath earlier this week and thought that it was from allergies. No recent virus. Had CTA which was negative for PE and showed moderate bilateral pleural effusions. EKG on admit with ST at 131 bpm. Started on IV lasix and brisk diuresis and symptomatic improvement ECHO completed and showed reduced LVEF 20-25%. RV mildly reduced. Troponin <0.04. Cardiology consulted with recommendations to transfer to [facility]. Transferred to [facility] on 4/8 for Heart Failure consult and cardiac cath. LHC showed only minimal CAD. Filling pressures and CO ok on RHC. PVCs noted on tele, but burden not high enough to cause CM. cMRI was performed prior to d/c. GDMT initiated. D/C weight 215 pounds.   Today he returns for HF follow up with his wife. Last visit coreg was started. Overall feeling much better.  Denies SOB/PND/Orthopnea. Able to walk up stairs without difficulty. Walking 2 miles a day and attending cardiac rehab 3 times a week.  SBP 90-110. Pulse 70s. Appetite ok. No fever or chills. Weight at home trending down 211--192 pounds.  pounds. Taking all medications.    Cardiac Studies ECHO 02/2021  EF 20-25%   RHC/LHC 02/22/21  Dist LAD lesion is 40% stenosed  Findings: Ao =102/71 (84) LV = 111/22 RA = 2 RV = 27/3 PA = 27/15 (21) PCW = 13 Fick cardiac output/index = 6.0/2.6 PVR = 1.2 SVR = 1086 Ao sat = 99% PA sat = 73%, 75% SVC sat = 81% Assessment: 1. Minimal non-obstructive CAD 2. 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 Well-compensated hemodynamics.   CMRI 02/25/21 1. Severely dilated left ventricle, EF 16% with severe  global hypokinesis. 2. Normal right ventricular size with moderately decreased systolic function, EF 22%. 3. Nonspecific inferior RV insertion site LGE. This suggests pressure/volume overload. 4. Mildly elevated ECV percentage in septum, not in amyloidosis Range.   ROS: All systems negative except as listed in HPI, PMH and Problem List.  SH:  Social History   Socioeconomic History  . Marital status: Married    Spouse name: Not on file  . Number of children: Not on file  . Years of education: Not on file  . Highest education level: Not on file  Occupational History    Employer: [employer]: Full Time   Tobacco Use  . Smoking status: Never Smoker  . Smokeless tobacco: Never Used  Substance and Sexual Activity  . Alcohol use: Not Currently  . Drug use: Never  . Sexual activity: Not on file  Other Topics Concern  . Not on file  Social History Narrative  . Not on file   Social Determinants of Health   Financial Resource Strain: Not on file  Food Insecurity: Not on file  Transportation Needs: Not on file  Physical Activity: Not on file  Stress: Not on file  Social Connections: Not on file  Intimate Partner Violence: Not on file    FH:  Family History  Problem Relation Age of Onset  . Hypertension Father     Past Medical History:  Diagnosis Date  . Anxiety   . Hypertension   . Rhinitis  Current Outpatient Medications  Medication Sig Dispense Refill  . acetaminophen (TYLENOL) 500 MG tablet Take 500 mg by mouth every 6 (six) hours as needed.    . ALPRAZolam (XANAX) 0.5 MG tablet Take 0.25-0.5 mg by mouth at bedtime as needed for anxiety or sleep.    Marland Kitchen aspirin-acetaminophen-caffeine (EXCEDRIN MIGRAINE) 250-250-65 MG tablet Take 2 tablets by mouth every 6 (six) hours as needed for headache.    . busPIRone (BUSPAR) 7.5 MG tablet Take 30 mg by mouth at bedtime.    . carvedilol (COREG) 3.125 MG tablet Take 1 tablet (3.125 mg total) by mouth 2  (two) times daily. 60 tablet 11  . clonazePAM (KLONOPIN) 1 MG tablet Take 1 mg by mouth at bedtime.    . digoxin (LANOXIN) 0.125 MG tablet Take 1 tablet (0.125 mg total) by mouth daily. 30 tablet 3  . DULoxetine (CYMBALTA) 30 MG capsule Take 90 mg by mouth at bedtime.    . empagliflozin (JARDIANCE) 10 MG TABS tablet Take 1 tablet (10 mg total) by mouth daily before breakfast. (Patient not taking: Reported on 04/04/2021) 90 tablet 3  . famotidine (PEPCID) 20 MG tablet Take 20 mg by mouth daily as needed.    . fluticasone (FLONASE) 50 MCG/ACT nasal spray Place 2 sprays into both nostrils at bedtime.    . Guaifenesin (MUCINEX MAXIMUM STRENGTH) 1200 MG TB12 Take 1,200 mg by mouth at bedtime.    . montelukast (SINGULAIR) 10 MG tablet Take 10 mg by mouth at bedtime.    . Omega-3 Fatty Acids (FISH OIL) 1000 MG CAPS Take 1,000 mg by mouth.    . polyethylene glycol (MIRALAX / GLYCOLAX) 17 g packet Take 17 g by mouth daily as needed.    . Probiotic Product (PROBIOTIC-10 ULTIMATE PO) Take by mouth.    . pseudoephedrine (SUDAFED) 30 MG tablet Take 30 mg by mouth every 6 (six) hours as needed for congestion.    . rosuvastatin (CRESTOR) 10 MG tablet Take 1 tablet (10 mg total) by mouth daily. 30 tablet 6  . sacubitril-valsartan (ENTRESTO) 24-26 MG Take 1 tablet by mouth 2 (two) times daily. 60 tablet 11  . spironolactone (ALDACTONE) 25 MG tablet Take 1 tablet (25 mg total) by mouth daily. 30 tablet 11  . tadalafil (CIALIS) 5 MG tablet Take 5 mg by mouth at bedtime.     No current facility-administered medications for this encounter.    Vitals:   04/04/21 1139  BP: 132/78  Pulse: 90  SpO2: 96%  Weight: 89.9 kg (198 lb 3.2 oz)   Wt Readings from Last 3 Encounters:  04/04/21 89.9 kg (198 lb 3.2 oz)  03/01/21 97.2 kg (214 lb 3.2 oz)  02/25/21 97.7 kg (215 lb 6.2 oz)    PHYSICAL EXAM: General:  Well appearing. No resp difficulty HEENT: normal Neck: supple. no JVD. Carotids 2+ bilat; no bruits. No  lymphadenopathy or thryomegaly appreciated. Cor: PMI nondisplaced. Regular rate & rhythm. No rubs, gallops or murmurs. Lungs: clear Abdomen: soft, nontender, nondistended. No hepatosplenomegaly. No bruits or masses. Good bowel sounds. Extremities: no cyanosis, clubbing, rash, edema Neuro: alert & orientedx3, cranial nerves grossly intact. moves all 4 extremities w/o difficulty. Affect pleasant    ASSESSMENT & PLAN: 1.HFrEF: Nonischemic cardiomyopathy, uncertain etiology.  Echo with EF 20-25% RV mildly reduced. LHC minimal CAD.  Filling pressures and CO ok on RHC.  - CMRI - EF 16% RV 30%  - Plan to repeat ECHO in 3 months. --->July 2022  - NYHA  I. Volume status stable.   Continue digoxin 0.125 mg daily  - Continue spironolactone 25 daily.  - Continue Entresto 24/26 bid. BP too soft to titrate - Continue dapagliflozin 10 mg daily.  - Continue coreg 3.125 mg twice a day.  -SBP at home 90-100 - Set up repeat ECHO in 2 months.   2. Anxiety: Meds per PCP  3. PVCs: -   Recent hospitalization not felt to be high burden to explain cardiomyopathy.  -Continue coreg 3.125 mg twice a day.   - Set up sleep study.   4. HTN  Stable. Reports white coat at office visit except his PCP. Low BP at home.   Letter provided with for work.   Follow with Dr Haroldine Laws 2 months with an ECHO.     Senita Corredor NP-C  11:45 AM

## 2021-04-04 ENCOUNTER — Ambulatory Visit (HOSPITAL_COMMUNITY)
Admission: RE | Admit: 2021-04-04 | Discharge: 2021-04-04 | Disposition: A | Discharge status: Home / Self Care | Payer: Medicare Other | Source: Ambulatory Visit | Attending: Adult Health | Admitting: Adult Health

## 2021-04-04 ENCOUNTER — Encounter (HOSPITAL_COMMUNITY): Payer: Self-pay

## 2021-04-04 ENCOUNTER — Other Ambulatory Visit: Payer: Self-pay

## 2021-04-04 VITALS — BP 132/78 | HR 90 | Wt 198.2 lb

## 2021-04-04 DIAGNOSIS — I10 Essential (primary) hypertension: Secondary | ICD-10-CM

## 2021-04-04 DIAGNOSIS — I493 Ventricular premature depolarization: Secondary | ICD-10-CM | POA: Diagnosis not present

## 2021-04-04 DIAGNOSIS — I5022 Chronic systolic (congestive) heart failure: Secondary | ICD-10-CM | POA: Diagnosis present

## 2021-04-04 NOTE — Patient Instructions (Signed)
It was great to see you today! No medication changes are needed at this time.  Your physician has recommended that you have a sleep study. This test records several body functions during sleep, including: brain activity, eye movement, oxygen and carbon dioxide blood levels, heart rate and rhythm, breathing rate and rhythm, the flow of air through your mouth and nose, snoring, body muscle movements, and chest and belly movement.   Your physician recommends that you schedule a follow-up appointment in: 2-3 months with Dr Haroldine Laws and echo  Your physician has requested that you have an echocardiogram. Echocardiography is a painless test that uses sound waves to create images of your heart. It provides your doctor with information about the size and shape of your heart and how well your heart's chambers and valves are working. This procedure takes approximately one hour. There are no restrictions for this procedure.   Do the following things EVERYDAY: 1) Weigh yourself in the morning before breakfast. Write it down and keep it in a log. 2) Take your medicines as prescribed 3) Eat low salt foods--Limit salt (sodium) to 2000 mg per day.  4) Stay as active as you can everyday 5) Limit all fluids for the day to less than 2 liters  At the [facility], you and your health needs are our priority. As part of our continuing mission to provide you with exceptional heart care, we have created designated Provider Care Teams. These Care Teams include your primary Cardiologist (physician) and Advanced Practice Providers (APPs- Physician Assistants and Nurse Practitioners) who all work together to provide you with the care you need, when you need it.   You may see any of the following providers on your designated Care Team at your next follow up: Marland Kitchen Dr Glori Bickers . Dr Loralie Champagne . Dr Vickki Muff . Darrick Grinder, NP . Lyda Jester, [place] . Audry Riles,  PharmD   Please be sure to bring in all your medications bottles to every appointment.   If you have any questions or concerns before your next appointment please send [place] a message through [place] or call our office at [phone].    TO LEAVE A MESSAGE FOR THE NURSE SELECT OPTION 2, PLEASE LEAVE A MESSAGE INCLUDING: . YOUR NAME . DATE OF BIRTH . CALL BACK NUMBER . REASON FOR CALL**this is important as we prioritize the call backs  YOU WILL RECEIVE A CALL BACK THE SAME DAY AS LONG AS YOU CALL BEFORE 4:00 PM

## 2021-04-04 NOTE — Progress Notes (Signed)
Patient given home sleep study device and instructions to complete during clinic appt.today.   He will complete the study in the next several days.  I asked that he call us back with any question or concerns.

## 2021-04-12 ENCOUNTER — Telehealth (HOSPITAL_COMMUNITY): Payer: Self-pay | Admitting: Surgery

## 2021-04-12 NOTE — Telephone Encounter (Signed)
I called Neil Weber to remind him to complete the ordered home sleep study.  He tells me that he plans to complete the study this weekend or next week.  I asked that he call me back with any concerns or questions.

## 2021-04-17 ENCOUNTER — Encounter (HOSPITAL_BASED_OUTPATIENT_CLINIC_OR_DEPARTMENT_OTHER): Payer: Medicare Other | Admitting: Cardiology

## 2021-04-17 DIAGNOSIS — I493 Ventricular premature depolarization: Secondary | ICD-10-CM | POA: Diagnosis not present

## 2021-04-17 DIAGNOSIS — I5021 Acute systolic (congestive) heart failure: Secondary | ICD-10-CM

## 2021-04-22 ENCOUNTER — Ambulatory Visit: Payer: Medicare Other

## 2021-04-22 DIAGNOSIS — I493 Ventricular premature depolarization: Secondary | ICD-10-CM

## 2021-04-22 DIAGNOSIS — I5022 Chronic systolic (congestive) heart failure: Secondary | ICD-10-CM

## 2021-04-22 NOTE — Procedures (Signed)
   Sleep Study Report  Patient Information Name: Neil Weber  ID: [id number] Birth Date: May 19, 1956  Age: 65 Gender:male Study Date: 04/17/2021 Referring Physician:  Glori Bickers, MD  TEST DESCRIPTION: Home sleep apnea testing was completed using the WatchPat, a Type 1 device, utilizing  peripheral arterial tonometry (PAT), chest movement, actigraphy, pulse oximetry, pulse rate, body position and snore.  AHI was calculated with apnea and hypopnea using valid sleep time as the denominator. RDI includes apneas,  hypopneas, and RERAs. The data acquired and the scoring of sleep and all associated events were performed in  accordance with the recommended standards and specifications as outlined in the AASM Manual for the Scoring of  Sleep and Associated Events 2.2.0 (2015).   FINDINGS: 1. No evidence of Obstructive Sleep Apnea with AHI 4.4/hr.  2. No Central Sleep Apnea. 3. Oxygen desaturations as low as 86%. 4. Minmal snoring was present. O2 sats were < 88% for 0.28minutes. 5. Total sleep time was 7 hrs and 2 min. 6. 12.8% of total sleep time was spent in REM sleep.  7. Normal sleep onset latency at 20 min.  8. Normal REM sleep onset latency at 99 min.  9. Total awakenings were 2.   DIAGNOSIS:  Normal study with no significant sleep disordered breathing.  RECOMMENDATIONS:  1. Normal study with no significant sleep disordered breathing.  2. Healthy sleep recommendations include: adequate nightly sleep (normal 7-9 hrs/night), avoidance of caffeine after  noon and alcohol near bedtime, and maintaining a sleep environment that is cool, dark and quiet.  3. Weight loss for overweight patients is recommended.   4. Snoring recommendations include: weight loss where appropriate, side sleeping, and avoidance of alcohol before  Bed.  5. Operation of motor vehicle or dangerous equipment must be avoided when feeling drowsy, excessively sleepy, or  mentally fatigued.   6. An ENT  consultation which may be useful for specific causes of and possible treatment of bothersome snoring .   7. Weight loss may be of benefit in reducing the severity of snoring.   Report prepared by: Signature: Fransico Him, MD; [facility]; [place], [place] Board of Sleep Medicine  Electronically Signed: Apr 22, 2021

## 2021-04-25 ENCOUNTER — Encounter (HOSPITAL_COMMUNITY): Payer: Self-pay

## 2021-04-25 ENCOUNTER — Other Ambulatory Visit (HOSPITAL_COMMUNITY): Payer: Self-pay

## 2021-04-25 ENCOUNTER — Other Ambulatory Visit: Payer: Self-pay

## 2021-04-25 ENCOUNTER — Ambulatory Visit (HOSPITAL_COMMUNITY)
Admission: RE | Admit: 2021-04-25 | Discharge: 2021-04-25 | Disposition: A | Discharge status: Home / Self Care | Payer: Medicare Other | Source: Ambulatory Visit | Attending: Cardiology | Admitting: Cardiology

## 2021-04-25 VITALS — BP 124/86 | HR 97 | Wt 195.0 lb

## 2021-04-25 DIAGNOSIS — I110 Hypertensive heart disease with heart failure: Secondary | ICD-10-CM | POA: Insufficient documentation

## 2021-04-25 DIAGNOSIS — F32A Depression, unspecified: Secondary | ICD-10-CM | POA: Diagnosis not present

## 2021-04-25 DIAGNOSIS — Z7984 Long term (current) use of oral hypoglycemic drugs: Secondary | ICD-10-CM | POA: Diagnosis not present

## 2021-04-25 DIAGNOSIS — Z8546 Personal history of malignant neoplasm of prostate: Secondary | ICD-10-CM | POA: Insufficient documentation

## 2021-04-25 DIAGNOSIS — I5020 Unspecified systolic (congestive) heart failure: Secondary | ICD-10-CM | POA: Insufficient documentation

## 2021-04-25 DIAGNOSIS — I2510 Atherosclerotic heart disease of native coronary artery without angina pectoris: Secondary | ICD-10-CM | POA: Insufficient documentation

## 2021-04-25 DIAGNOSIS — Z79899 Other long term (current) drug therapy: Secondary | ICD-10-CM | POA: Diagnosis not present

## 2021-04-25 DIAGNOSIS — I428 Other cardiomyopathies: Secondary | ICD-10-CM | POA: Insufficient documentation

## 2021-04-25 DIAGNOSIS — F419 Anxiety disorder, unspecified: Secondary | ICD-10-CM | POA: Insufficient documentation

## 2021-04-25 DIAGNOSIS — I5021 Acute systolic (congestive) heart failure: Secondary | ICD-10-CM

## 2021-04-25 MED ORDER — ROSUVASTATIN CALCIUM 10 MG PO TABS: 10.0000 mg | ORAL_TABLET | Freq: Every day | ORAL | 3 refills | Status: DC

## 2021-04-25 NOTE — Progress Notes (Signed)
PCP: No primary care provider on file. PCP Cardiologist: Dr. Haroldine Laws  HPI:  Mr Neil Weber is a 65 year old with history of anxiety, depression, and prostate cancer. No family history of coronary disease. Recently diagnosed HFrEF 02/2021.   On 4/6 he went to PCP due to worsening shortness of breath which was new for him. He was sent to [facility] for evaluation. He first noticed some shortness of breath and thought that it was from allergies.  No recent virus. Had CTA which was negative for PE and showed moderate bilateral pleural effusions. EKG on admit with ST at 131 bpm. Started on IV lasix and brisk diuresis and symptomatic improvement  ECHO completed and showed reduced LVEF 20-25%. RV mildly reduced. Troponin < 0.04. Cardiology consulted with recommendations to transfer to [facility]. Transferred to [facility] on 4/8 for Heart Failure consult and cardiac cath. LHC showed only minimal CAD. Filling pressures and CO ok on RHC. PVCs noted on tele, but burden not high enough to cause CM. cMRI was performed prior to d/c. GDMT initiated. D/C weight 215 pounds.   He was seen on 4/15 for HF follow up post discharge. Overall feeling fine. Denied SOB/PND/Orthopnea. Appetite ok. No fever or chills. Weight at home had been 210-211 pounds. SBP at home 120s. Says he gets "white coat".  Taking all medications. Wants to go back to work. BP was elevated to 160/70 and tachycardic at 116. Carvedilol 3.125 mg BID and spironolactone 25 mg daily were initiated.   He presented for HF follow up on 5/19. Overall feeling much better.  Denies SOB/PND/Orthopnea. Able to walk up stairs without difficulty. Walking 2 miles a day and attending cardiac rehab 3 times a week.  SBP 90-110. Pulse 70s. Appetite ok. No fever or chills. Weight at home trending down 211--192 pounds.  pounds. Taking all medications. BP in clinic improved to 132/78 and HR 90.  Today he returns to HF clinic for pharmacist medication titration. At last  visit with NP, no medication changes were made given reported SBP at home 90-100. Overall, he is feeling very well. Denies dizziness/lightheadedness, chest pain, or palpitations. Breathing is good and denies shortness of breath. Is able to complete all ADLs. Does cardiac rehab 3 times per week through the end of July. He also walks 2 miles every night. His weight at home has been trending down (188 lbs this AM). He does not take any diuretics. No PND/orthopnea, LEE, and lungs clear. He is eating much better - avoids fast food, no salt added to foods, lots of fruits and vegetables, and portion control. He is taking all medications as prescribed. Home BP low as 80/50s on a few occassions (usually at night) and highest have been 110/70s. HR 60-70s at home. BP in clinic today improved compared to last visit.    HF Medications: Carvedilol 3.125 mg BID Entresto 24/26 mg BID Spironolactone 25 mg daily Jardiance 10 mg daily Digoxin 0.125 mg daily  Has the patient been experiencing any side effects to the medications prescribed?  no  Does the patient have any problems obtaining medications due to transportation or finances?   No - has [employer] (income too high for patient assistance)  Understanding of regimen: excellent Understanding of indications: excellent Potential of compliance: excellent Patient understands to avoid NSAIDs. Patient understands to avoid decongestants.    Pertinent Lab Values: As of 4/15: Serum creatinine 1.20, BUN 13, Potassium 4.5, Sodium 133  Vital Signs: Weight: 195 lb (last clinic weight:  198 lbs) Blood pressure: 124/86 mmHg  Heart rate: 97 bpm   Assessment/Plan: 1.HFrEF: Nonischemic cardiomyopathy, uncertain etiology.  Echo with EF 20-25% RV mildly reduced. LHC minimal CAD.  Filling pressures and CO ok on RHC. - CMRI - EF 16% RV 30% - Plan to repeat ECHO in 3 months. --->scheduled for 07/05/21 - NYHA  I. Volume status stable. - Continue carvedilol 3.125 mg  BID - Continue Entresto 24/26 BID - Continue spironolactone 25 daily - Continue Farxiga 10 mg daily - Continue digoxin 0.125 mg daily. He took his digoxin already this AM. Unable to check trough level. - Home BP limits titration of medications again today. Continue current regimen.   2. Anxiety: Meds per PCP   3. PVCs: -   Recent hospitalization not felt to be high burden to explain cardiomyopathy. -Continue carvedilol 3.125 mg BID  - Sleep study set up   4. HTN  - Stable. Reports white coat at office visit except his PCP. - Low BP at home.   Follow up in August for ECHO + appt with Dr. Frances Furbish, PharmD, [facility] Pharmacist [phone]

## 2021-04-25 NOTE — Patient Instructions (Signed)
It was a pleasure seeing you today!  MEDICATIONS: -No medication changes today -Call if you have questions about your medications.  NEXT APPOINTMENT: Return to clinic in August for ECHO + appt with Dr. Haroldine Laws.  In general, to take care of your heart failure: -Limit your fluid intake to 2 Liters (half-gallon) per day.   -Limit your salt intake to ideally 2-3 grams (2000-3000 mg) per day. -Weigh yourself daily and record, and bring that "weight diary" to your next appointment.  (Weight gain of 2-3 pounds in 1 day typically means fluid weight.) -The medications for your heart are to help your heart and help you live longer.   -Please contact us before stopping any of your heart medications.  Call the clinic at [phone] with questions or to reschedule future appointments.

## 2021-05-06 ENCOUNTER — Encounter: Payer: Self-pay | Admitting: *Deleted

## 2021-05-06 ENCOUNTER — Telehealth: Payer: Self-pay | Admitting: *Deleted

## 2021-05-06 DIAGNOSIS — I10 Essential (primary) hypertension: Secondary | ICD-10-CM

## 2021-05-06 NOTE — Telephone Encounter (Signed)
Informed patient of sleep study results and patient understanding was verbalized.  Patient wants to speak to his cardiologist about the test result before deciding to move forward. NPSG to be scheduled.

## 2021-05-06 NOTE — Telephone Encounter (Signed)
-----   Message from Sueanne Margarita, MD sent at 04/22/2021  5:19 PM EDT ----- Normal home sleep study - based on guidelines for initial normal home sleep study - in lab PSG will be ordered

## 2021-05-06 NOTE — Telephone Encounter (Signed)
This encounter was created in error - please disregard.

## 2021-07-05 ENCOUNTER — Encounter (HOSPITAL_COMMUNITY): Payer: Self-pay | Admitting: Internal Medicine

## 2021-07-05 ENCOUNTER — Ambulatory Visit (HOSPITAL_COMMUNITY)
Admission: RE | Admit: 2021-07-05 | Discharge: 2021-07-05 | Disposition: A | Discharge status: Home / Self Care | Payer: Medicare Other | Source: Ambulatory Visit | Attending: Internal Medicine | Admitting: Internal Medicine

## 2021-07-05 ENCOUNTER — Other Ambulatory Visit: Payer: Self-pay

## 2021-07-05 ENCOUNTER — Ambulatory Visit (HOSPITAL_BASED_OUTPATIENT_CLINIC_OR_DEPARTMENT_OTHER)
Admission: RE | Admit: 2021-07-05 | Discharge: 2021-07-05 | Disposition: A | Discharge status: Home / Self Care | Payer: Medicare Other | Source: Ambulatory Visit | Attending: Internal Medicine | Admitting: Internal Medicine

## 2021-07-05 VITALS — BP 102/69 | HR 81 | Wt 186.4 lb

## 2021-07-05 DIAGNOSIS — I10 Essential (primary) hypertension: Secondary | ICD-10-CM

## 2021-07-05 DIAGNOSIS — I110 Hypertensive heart disease with heart failure: Secondary | ICD-10-CM | POA: Insufficient documentation

## 2021-07-05 DIAGNOSIS — I5022 Chronic systolic (congestive) heart failure: Secondary | ICD-10-CM | POA: Diagnosis present

## 2021-07-05 DIAGNOSIS — F32A Depression, unspecified: Secondary | ICD-10-CM | POA: Diagnosis not present

## 2021-07-05 DIAGNOSIS — I493 Ventricular premature depolarization: Secondary | ICD-10-CM | POA: Insufficient documentation

## 2021-07-05 DIAGNOSIS — Z79899 Other long term (current) drug therapy: Secondary | ICD-10-CM | POA: Diagnosis not present

## 2021-07-05 DIAGNOSIS — Z8546 Personal history of malignant neoplasm of prostate: Secondary | ICD-10-CM | POA: Insufficient documentation

## 2021-07-05 DIAGNOSIS — F419 Anxiety disorder, unspecified: Secondary | ICD-10-CM | POA: Insufficient documentation

## 2021-07-05 DIAGNOSIS — I2510 Atherosclerotic heart disease of native coronary artery without angina pectoris: Secondary | ICD-10-CM | POA: Diagnosis not present

## 2021-07-05 DIAGNOSIS — I428 Other cardiomyopathies: Secondary | ICD-10-CM | POA: Insufficient documentation

## 2021-07-05 DIAGNOSIS — Z8249 Family history of ischemic heart disease and other diseases of the circulatory system: Secondary | ICD-10-CM | POA: Insufficient documentation

## 2021-07-05 LAB — ECHOCARDIOGRAM COMPLETE
Area-P 1/2: 3.68 cm2
Calc EF: 49.2 %
S' Lateral: 4.6 cm
Single Plane A2C EF: 44.7 %
Single Plane A4C EF: 52.5 %

## 2021-07-05 MED ORDER — ENTRESTO 49-51 MG PO TABS: 1.0000 | ORAL_TABLET | Freq: Two times a day (BID) | ORAL | 3 refills | Status: DC

## 2021-07-05 NOTE — Addendum Note (Signed)
Encounter addended by: Scarlette Calico, RN on: 07/05/2021 3:04 PM  Actions taken: Medication long-term status modified, Order list changed, Pharmacy for encounter modified, Clinical Note Signed

## 2021-07-05 NOTE — Patient Instructions (Signed)
Stop Digoxin  Increase Entresto to 49/51 mg Twice daily   Your physician recommends that you schedule a follow-up appointment in: 4 months  If you have any questions or concerns before your next appointment please send [place] a message through [place] or call our office at [phone].    TO LEAVE A MESSAGE FOR THE NURSE SELECT OPTION 2, PLEASE LEAVE A MESSAGE INCLUDING: YOUR NAME DATE OF BIRTH CALL BACK NUMBER REASON FOR CALL**this is important as we prioritize the call backs  YOU WILL RECEIVE A CALL BACK THE SAME DAY AS LONG AS YOU CALL BEFORE 4:00 PM  milAt the [facility], you and your health needs are our priority. As part of our continuing mission to provide you with exceptional heart care, we have created designated Provider Care Teams. These Care Teams include your primary Cardiologist (physician) and Advanced Practice Providers (APPs- Physician Assistants and Nurse Practitioners) who all work together to provide you with the care you need, when you need it.   You may see any of the following providers on your designated Care Team at your next follow up: Dr Glori Bickers Dr Loralie Champagne Dr Patrice Paradise, NP Lyda Jester, [place] Ginnie Smart Audry Riles, PharmD   Please be sure to bring in all your medications bottles to every appointment.

## 2021-07-05 NOTE — Progress Notes (Signed)
  Echocardiogram 2D Echocardiogram has been performed.  Cherry Turlington G Denea Cheaney 07/05/2021, 1:56 PM

## 2021-07-05 NOTE — Progress Notes (Signed)
PCP:  Primary HF Cardiologist: Dr Haroldine Laws  HPI:  Neil Weber is a 65 year old with history of anxiety, depression, and prostate cancer. Diagnosed with systolic HF EF [id number].   Presented with acute HF in 4/22.  LHC showed only minimal CAD. Filling pressures and CO ok on RHC. PVCs noted on tele, but burden not high enough to cause CM. cMRI was performed prior to d/c EF 16%. GDMT initiated. D/C weight 215 pounds.   Echo today 07/05/21 EF 25-30%  Today he returns for HF follow up with his wife. Feels great. Walking 3 miles a day up and down hills at a fast pace. No SOB or CP. No edema, orthopnea or PND. No dizziness with meds. BP 105-120 range   Echo t    Cardiac Studies ECHO 02/2021  EF 20-25%   RHC/LHC 02/22/21 Dist LAD lesion is 40% stenosed  Findings:  Ao =102/71 (84) LV = 111/22 RA = 2 RV = 27/3 PA = 27/15 (21) PCW = 13 Fick cardiac output/index = 6.0/2.6 PVR = 1.2 SVR = 1086 Ao sat = 99% PA sat = 73%, 75% SVC sat = 81%  Assessment: 1. Minimal non-obstructive CAD 2. 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 Well-compensated hemodynamics.   CMRI 02/25/21 1. Severely dilated left ventricle, EF 16% with severe global hypokinesis.  2. Normal right ventricular size with moderately decreased systolic function, EF [id number].  3. Nonspecific inferior RV insertion site LGE. This suggests pressure/volume overload.  4. Mildly elevated ECV percentage in septum, not in amyloidosis Range.   ROS: All systems negative except as listed in HPI, PMH and Problem List.  SH:  Social History   Socioeconomic History   Marital status: Married    Spouse name: Not on file   Number of children: Not on file   Years of education: Not on file   Highest education level: Not on file  Occupational History    Employer: [employer]    Comment: Full Time   Tobacco Use   Smoking status: Never   Smokeless tobacco: Never  Substance and Sexual Activity   Alcohol use: Not Currently   Drug use: Never   Sexual  activity: Not on file  Other Topics Concern   Not on file  Social History Narrative   Not on file   Social Determinants of Health   Financial Resource Strain: Not on file  Food Insecurity: Not on file  Transportation Needs: Not on file  Physical Activity: Not on file  Stress: Not on file  Social Connections: Not on file  Intimate Partner Violence: Not on file    FH:  Family History  Problem Relation Age of Onset   Hypertension Father     Past Medical History:  Diagnosis Date   Anxiety    Hypertension    Rhinitis     Current Outpatient Medications  Medication Sig Dispense Refill   acetaminophen (TYLENOL) 500 MG tablet Take 500 mg by mouth every 6 (six) hours as needed.     ALPRAZolam (XANAX) 0.5 MG tablet Take 0.25-0.5 mg by mouth at bedtime as needed for anxiety or sleep.     aspirin-acetaminophen-caffeine (EXCEDRIN MIGRAINE) 250-250-65 MG tablet Take 2 tablets by mouth every 6 (six) hours as needed for headache.     busPIRone (BUSPAR) 7.5 MG tablet Take 30 mg by mouth at bedtime.     carvedilol (COREG) 3.125 MG tablet Take 1 tablet (3.125 mg total) by mouth 2 (two) times daily. 60 tablet 11   clonazePAM (  KLONOPIN) 1 MG tablet Take 1 mg by mouth at bedtime.     digoxin (LANOXIN) 0.125 MG tablet Take 1 tablet (0.125 mg total) by mouth daily. 30 tablet 3   DULoxetine (CYMBALTA) 30 MG capsule Take 90 mg by mouth at bedtime.     empagliflozin (JARDIANCE) 10 MG TABS tablet Take 1 tablet (10 mg total) by mouth daily before breakfast. 90 tablet 3   famotidine (PEPCID) 20 MG tablet Take 20 mg by mouth daily as needed.     fluticasone (FLONASE) 50 MCG/ACT nasal spray Place 2 sprays into both nostrils at bedtime.     Guaifenesin (MUCINEX MAXIMUM STRENGTH) 1200 MG TB12 Take 1,200 mg by mouth at bedtime.     montelukast (SINGULAIR) 10 MG tablet Take 10 mg by mouth at bedtime.     Omega-3 Fatty Acids (FISH OIL) 1000 MG CAPS Take 1,000 mg by mouth.     polyethylene glycol (MIRALAX /  GLYCOLAX) 17 g packet Take 17 g by mouth daily as needed.     Probiotic Product (PROBIOTIC-10 ULTIMATE PO) Take by mouth.     rosuvastatin (CRESTOR) 10 MG tablet Take 1 tablet (10 mg total) by mouth daily. 90 tablet 3   sacubitril-valsartan (ENTRESTO) 24-26 MG Take 1 tablet by mouth 2 (two) times daily. 60 tablet 11   spironolactone (ALDACTONE) 25 MG tablet Take 1 tablet (25 mg total) by mouth daily. 30 tablet 11   tadalafil (CIALIS) 5 MG tablet Take 5 mg by mouth as needed for erectile dysfunction.     No current facility-administered medications for this encounter.    Vitals:   07/05/21 1417  Pulse: 81  SpO2: 96%  Weight: 84.6 kg (186 lb 6.4 oz)   Wt Readings from Last 3 Encounters:  07/05/21 84.6 kg (186 lb 6.4 oz)  04/25/21 88.5 kg (195 lb)  04/04/21 89.9 kg (198 lb 3.2 oz)    General:  Well appearing. No resp difficulty HEENT: normal Neck: supple. no JVD. Carotids 2+ bilat; no bruits. No lymphadenopathy or thryomegaly appreciated. Cor: PMI nondisplaced. Regular rate & rhythm. No rubs, gallops or murmurs. Lungs: clear Abdomen: soft, nontender, nondistended. No hepatosplenomegaly. No bruits or masses. Good bowel sounds. Extremities: no cyanosis, clubbing, rash, edema Neuro: alert & orientedx3, cranial nerves grossly intact. moves all 4 extremities w/o difficulty. Affect pleasant   ASSESSMENT & PLAN:  1. Chronic systolic HF: Nonischemic cardiomyopathy, uncertain etiology.   - Echo 4/22 with EF 20-25% RV mildly reduced. LHC minimal CAD.  Filling pressures and CO ok on RHC.  - cMRI 4/22- EF 16% RV 30% LGE at RV insertion site  - Plan to repeat ECHO in 3 months. --->July 2022  - Echo today 07/05/21 EF ~30% - Stable NYHA  I. Volume status stable.  - Stop digoxin - Continue spironolactone 25 daily.  - Increase Entresto to 49/51 bid as BP tolerates. Can drop back if not tolerating - Continue dapagliflozin 10 mg daily.  - Continue coreg 3.125 mg twice a day.  - As NYHA I and EF  imprvoing will not refer for ICD at this point. Will recheck echo in 6 months - recent labs ok   2. PVCs: -   Recent hospitalization not felt to be high burden to explain cardiomyopathy.  - Continue coreg 3.125 mg twice a day.     3. HTN   - BP ok      Glori Bickers MD 2:18 PM

## 2021-11-05 ENCOUNTER — Ambulatory Visit (HOSPITAL_COMMUNITY)
Admission: RE | Admit: 2021-11-05 | Discharge: 2021-11-05 | Disposition: A | Discharge status: Home / Self Care | Payer: Medicare Other | Source: Ambulatory Visit | Attending: Internal Medicine | Admitting: Internal Medicine

## 2021-11-05 ENCOUNTER — Other Ambulatory Visit: Payer: Self-pay

## 2021-11-05 VITALS — BP 121/62 | HR 82 | Wt 195.2 lb

## 2021-11-05 DIAGNOSIS — I10 Essential (primary) hypertension: Secondary | ICD-10-CM

## 2021-11-05 DIAGNOSIS — F419 Anxiety disorder, unspecified: Secondary | ICD-10-CM | POA: Insufficient documentation

## 2021-11-05 DIAGNOSIS — Z7984 Long term (current) use of oral hypoglycemic drugs: Secondary | ICD-10-CM | POA: Insufficient documentation

## 2021-11-05 DIAGNOSIS — I5021 Acute systolic (congestive) heart failure: Secondary | ICD-10-CM

## 2021-11-05 DIAGNOSIS — I5022 Chronic systolic (congestive) heart failure: Secondary | ICD-10-CM | POA: Insufficient documentation

## 2021-11-05 DIAGNOSIS — F32A Depression, unspecified: Secondary | ICD-10-CM | POA: Diagnosis not present

## 2021-11-05 DIAGNOSIS — Z79899 Other long term (current) drug therapy: Secondary | ICD-10-CM | POA: Insufficient documentation

## 2021-11-05 DIAGNOSIS — I2510 Atherosclerotic heart disease of native coronary artery without angina pectoris: Secondary | ICD-10-CM | POA: Insufficient documentation

## 2021-11-05 DIAGNOSIS — I493 Ventricular premature depolarization: Secondary | ICD-10-CM | POA: Diagnosis not present

## 2021-11-05 DIAGNOSIS — Z8546 Personal history of malignant neoplasm of prostate: Secondary | ICD-10-CM | POA: Insufficient documentation

## 2021-11-05 DIAGNOSIS — I110 Hypertensive heart disease with heart failure: Secondary | ICD-10-CM | POA: Insufficient documentation

## 2021-11-05 LAB — BRAIN NATRIURETIC PEPTIDE: B Natriuretic Peptide: 10.3 pg/mL (ref 0.0–100.0)

## 2021-11-05 LAB — BASIC METABOLIC PANEL
Anion gap: 8 (ref 5–15)
BUN: 16 mg/dL (ref 8–23)
CO2: 22 mmol/L (ref 22–32)
Calcium: 9.1 mg/dL (ref 8.9–10.3)
Chloride: 104 mmol/L (ref 98–111)
Creatinine, Ser: 0.94 mg/dL (ref 0.61–1.24)
GFR, Estimated: >60 mL/min (ref >60)
Glucose, Bld: 100 mg/dL — ABNORMAL HIGH (ref 70–99)
Potassium: 4 mmol/L (ref 3.5–5.1)
Sodium: 134 mmol/L — ABNORMAL LOW (ref 135–145)

## 2021-11-05 NOTE — Patient Instructions (Signed)
Medication Changes:  No Change  Lab Work:  Labs done today, your results will be available in MyChart, we will contact you for abnormal readings.   Testing/Procedures:  Your physician has requested that you have an echocardiogram. Echocardiography is a painless test that uses sound waves to create images of your heart. It provides your doctor with information about the size and shape of your heart and how well your hearts chambers and valves are working. This procedure takes approximately one hour. There are no restrictions for this procedure.   Referrals:  none  Special Instructions // Education:  none  Follow-Up in: 4 months with Echocardiogram ( April 2023) **Call in March for appointment**   At the [facility], you and your health needs are our priority. We have a designated team specialized in the treatment of Heart Failure. This Care Team includes your primary Heart Failure Specialized Cardiologist (physician), Advanced Practice Providers (APPs- Physician Assistants and Nurse Practitioners), and Pharmacist who all work together to provide you with the care you need, when you need it.   You may see any of the following providers on your designated Care Team at your next follow up:  Dr Glori Bickers Dr Haynes Kerns, NP Lyda Jester, [place] [facility] [place], [place] Audry Riles, PharmD   Please be sure to bring in all your medications bottles to every appointment.   Need to Contact us:  If you have any questions or concerns before your next appointment please send [place] a message through [place] or call our office at [phone].    TO LEAVE A MESSAGE FOR THE NURSE SELECT OPTION 2, PLEASE LEAVE A MESSAGE INCLUDING: YOUR NAME DATE OF BIRTH CALL BACK NUMBER REASON FOR CALL**this is important as we prioritize the call backs  YOU WILL RECEIVE A CALL BACK THE SAME DAY AS LONG AS YOU CALL BEFORE 4:00 PM

## 2021-11-05 NOTE — Progress Notes (Signed)
PCP:  Primary HF Cardiologist: Dr Haroldine Laws  HPI:  Neil Weber is a 65 year old with history of anxiety, depression, and prostate cancer. Diagnosed with systolic HF EF 6/30.   Presented with acute HF in 4/22.  LHC showed only minimal CAD. Filling pressures and CO ok on RHC. PVCs noted on tele, but burden not high enough to cause CM. cMRI was performed prior to d/c EF 16%. GDMT initiated. D/C weight 215 pounds.   Echo today 07/05/21 EF 25-30%  Today he returns for HF follow up with his wife. Walks about 5-6 days a week, did 11 miles over the last 3 days. No SOB with this. Overall feeling fine. Denies palpitations, abnormal bleeding, CP, dizziness, edema, or PND/Orthopnea. Appetite ok. No fever or chills. Weight at home 185 pounds. Taking all medications. BP log from home shows 100-110s/60s.   Cardiac Studies ECHO 02/2021  EF 20-25%   RHC/LHC 02/22/21 Dist LAD lesion is 40% stenosed  Findings:  Ao =102/71 (84) LV = 111/22 RA = 2 RV = 27/3 PA = 27/15 (21) PCW = 13 Fick cardiac output/index = 6.0/2.6 PVR = 1.2 SVR = 1086 Ao sat = 99% PA sat = 73%, 75% SVC sat = 81%  Assessment: 1. Minimal non-obstructive CAD 2. 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 Well-compensated hemodynamics.   CMRI 02/25/21 1. Severely dilated left ventricle, EF 16% with severe global hypokinesis.  2. Normal right ventricular size with moderately decreased systolic function, EF 16%.  3. Nonspecific inferior RV insertion site LGE. This suggests pressure/volume overload.  4. Mildly elevated ECV percentage in septum, not in amyloidosis range.   ROS: All systems negative except as listed in HPI, PMH and Problem List.  SH:  Social History   Socioeconomic History   Marital status: Married    Spouse name: Not on file   Number of children: Not on file   Years of education: Not on file   Highest education level: Not on file  Occupational History    Employer: [employer]    Comment: Full Time   Tobacco Use    Smoking status: Never   Smokeless tobacco: Never  Substance and Sexual Activity   Alcohol use: Not Currently   Drug use: Never   Sexual activity: Not on file  Other Topics Concern   Not on file  Social History Narrative   Not on file   Social Determinants of Health   Financial Resource Strain: Not on file  Food Insecurity: Not on file  Transportation Needs: Not on file  Physical Activity: Not on file  Stress: Not on file  Social Connections: Not on file  Intimate Partner Violence: Not on file    FH:  Family History  Problem Relation Age of Onset   Hypertension Father     Past Medical History:  Diagnosis Date   Anxiety    Hypertension    Rhinitis     Current Outpatient Medications  Medication Sig Dispense Refill   acetaminophen (TYLENOL) 500 MG tablet Take 500 mg by mouth every 6 (six) hours as needed.     ALPRAZolam (XANAX) 0.5 MG tablet Take 0.25-0.5 mg by mouth at bedtime as needed for anxiety or sleep.     aspirin-acetaminophen-caffeine (EXCEDRIN MIGRAINE) 250-250-65 MG tablet Take 2 tablets by mouth every 6 (six) hours as needed for headache.     busPIRone (BUSPAR) 7.5 MG tablet Take 30 mg by mouth at bedtime.     carvedilol (COREG) 3.125 MG tablet Take 1 tablet (3.125  mg total) by mouth 2 (two) times daily. 60 tablet 11   clonazePAM (KLONOPIN) 1 MG tablet Take 1 mg by mouth at bedtime.     DULoxetine (CYMBALTA) 30 MG capsule Take 90 mg by mouth at bedtime.     empagliflozin (JARDIANCE) 10 MG TABS tablet Take 10 mg by mouth daily.     famotidine (PEPCID) 20 MG tablet Take 20 mg by mouth daily as needed.     fluticasone (FLONASE) 50 MCG/ACT nasal spray Place 2 sprays into both nostrils at bedtime.     Guaifenesin (MUCINEX MAXIMUM STRENGTH) 1200 MG TB12 Take 1,200 mg by mouth at bedtime.     montelukast (SINGULAIR) 10 MG tablet Take 10 mg by mouth at bedtime.     Omega-3 Fatty Acids (FISH OIL) 1000 MG CAPS Take 1,000 mg by mouth.     polyethylene glycol (MIRALAX /  GLYCOLAX) 17 g packet Take 17 g by mouth daily as needed.     Probiotic Product (PROBIOTIC-10 ULTIMATE PO) Take by mouth.     rosuvastatin (CRESTOR) 10 MG tablet Take 1 tablet (10 mg total) by mouth daily. 90 tablet 3   sacubitril-valsartan (ENTRESTO) 49-51 MG Take 1 tablet by mouth 2 (two) times daily. 180 tablet 3   spironolactone (ALDACTONE) 25 MG tablet Take 1 tablet (25 mg total) by mouth daily. 30 tablet 11   tadalafil (CIALIS) 5 MG tablet Take 5 mg by mouth as needed for erectile dysfunction.     No current facility-administered medications for this encounter.   BP 121/62    Pulse 82    Wt 88.5 kg (195 lb 3.2 oz)    SpO2 96%    BMI 25.06 kg/m   Wt Readings from Last 3 Encounters:  11/05/21 88.5 kg (195 lb 3.2 oz)  07/05/21 84.6 kg (186 lb 6.4 oz)  04/25/21 88.5 kg (195 lb)   PHYSICAL EXAM: General:  NAD. No resp difficulty HEENT: Normal Neck: Supple. No JVD. Carotids 2+ bilat; no bruits. No lymphadenopathy or thryomegaly appreciated. Cor: PMI nondisplaced. Regular rate & rhythm. No rubs, gallops or murmurs. Lungs: Clear Abdomen: Soft, nontender, nondistended. No hepatosplenomegaly. No bruits or masses. Good bowel sounds. Extremities: No cyanosis, clubbing, rash, edema Neuro: Alert & oriented x 3, cranial nerves grossly intact. Moves all 4 extremities w/o difficulty. Affect pleasant.  ASSESSMENT & PLAN:  1. Chronic systolic HF: Nonischemic cardiomyopathy, uncertain etiology.   - Echo 4/22 with EF 20-25% RV mildly reduced. LHC minimal CAD.  Filling pressures and CO ok on RHC.  - cMRI 4/22- EF 16% RV 30% LGE at RV insertion site  - Echo 07/05/21 EF ~30%, not referred for ICD with stable NYHA I symptoms - Stable NYHA  I. Volume status stable.  - Continue spironolactone 25 daily.  - Continue Entresto 49/51 mg bid. - Continue Jardiance 10 mg daily.  - Continue Coreg 3.125 mg bid. - Repeat echo in 2 months. - BMET today.   2. PVCs - Hospitalization 4/22 not felt to be high  burden to explain cardiomyopathy.  - Continue Coreg.   3. HTN   - BP ok   Follow up with Dr. Haroldine Laws in 4 months.   Neil Bihari FNP 2:30 PM  Patient seen and examined with the above-signed Advanced Practice Provider and/or Housestaff. I personally reviewed laboratory data, imaging studies and relevant notes. I independently examined the patient and formulated the important aspects of the plan. I have edited the note to reflect any of my changes  or salient points. I have personally discussed the plan with the patient and/or family.  Doing great. NYHA I. Very active. Resting HR in low to mid 60s. SBP at home 95-115. Compliant with meds.   General:  Well appearing. No resp difficulty HEENT: normal Neck: supple. no JVD. Carotids 2+ bilat; no bruits. No lymphadenopathy or thryomegaly appreciated. Cor: PMI nondisplaced. Regular rate & rhythm. No rubs, gallops or murmurs. Lungs: clear Abdomen: soft, nontender, nondistended. No hepatosplenomegaly. No bruits or masses. Good bowel sounds. Extremities: no cyanosis, clubbing, rash, edema Neuro: alert & orientedx3, cranial nerves grossly intact. moves all 4 extremities w/o difficulty. Affect pleasant  Doing great. NYHA I. BP too low to titrate meds. I did bedside echo and EF 40-45%. Will check labs.   Glori Bickers, MD  3:46 PM

## 2022-01-15 ENCOUNTER — Other Ambulatory Visit (HOSPITAL_COMMUNITY): Payer: Self-pay | Admitting: Internal Medicine

## 2022-03-12 ENCOUNTER — Other Ambulatory Visit (HOSPITAL_COMMUNITY): Payer: Self-pay | Admitting: Internal Medicine

## 2022-03-17 ENCOUNTER — Encounter (HOSPITAL_COMMUNITY): Payer: Self-pay

## 2022-03-18 ENCOUNTER — Ambulatory Visit (HOSPITAL_BASED_OUTPATIENT_CLINIC_OR_DEPARTMENT_OTHER)
Admission: RE | Admit: 2022-03-18 | Discharge: 2022-03-18 | Disposition: A | Discharge status: Home / Self Care | Payer: Medicare Other | Source: Ambulatory Visit | Attending: Internal Medicine | Admitting: Internal Medicine

## 2022-03-18 ENCOUNTER — Ambulatory Visit (HOSPITAL_COMMUNITY)
Admission: RE | Admit: 2022-03-18 | Discharge: 2022-03-18 | Disposition: A | Discharge status: Home / Self Care | Payer: Medicare Other | Source: Ambulatory Visit | Attending: Internal Medicine | Admitting: Internal Medicine

## 2022-03-18 ENCOUNTER — Encounter (HOSPITAL_COMMUNITY): Payer: Self-pay | Admitting: Internal Medicine

## 2022-03-18 VITALS — BP 110/76 | HR 83 | Wt 193.2 lb

## 2022-03-18 DIAGNOSIS — E782 Mixed hyperlipidemia: Secondary | ICD-10-CM | POA: Diagnosis not present

## 2022-03-18 DIAGNOSIS — I428 Other cardiomyopathies: Secondary | ICD-10-CM | POA: Diagnosis present

## 2022-03-18 DIAGNOSIS — Z79899 Other long term (current) drug therapy: Secondary | ICD-10-CM | POA: Insufficient documentation

## 2022-03-18 DIAGNOSIS — I110 Hypertensive heart disease with heart failure: Secondary | ICD-10-CM | POA: Insufficient documentation

## 2022-03-18 DIAGNOSIS — I10 Essential (primary) hypertension: Secondary | ICD-10-CM

## 2022-03-18 DIAGNOSIS — I5022 Chronic systolic (congestive) heart failure: Secondary | ICD-10-CM | POA: Diagnosis present

## 2022-03-18 DIAGNOSIS — I493 Ventricular premature depolarization: Secondary | ICD-10-CM | POA: Diagnosis not present

## 2022-03-18 DIAGNOSIS — Z7984 Long term (current) use of oral hypoglycemic drugs: Secondary | ICD-10-CM | POA: Insufficient documentation

## 2022-03-18 DIAGNOSIS — Z8546 Personal history of malignant neoplasm of prostate: Secondary | ICD-10-CM | POA: Insufficient documentation

## 2022-03-18 DIAGNOSIS — I5021 Acute systolic (congestive) heart failure: Secondary | ICD-10-CM | POA: Diagnosis not present

## 2022-03-18 LAB — LIPID PANEL
Cholesterol: 118 mg/dL (ref 0–200)
HDL: 53 mg/dL (ref >40)
LDL Cholesterol: 47 mg/dL (ref 0–99)
Total CHOL/HDL Ratio: 2.2 RATIO
Triglycerides: 92 mg/dL (ref <150)
VLDL: 18 mg/dL (ref 0–40)

## 2022-03-18 LAB — COMPREHENSIVE METABOLIC PANEL
ALT: 12 U/L (ref 0–44)
AST: 17 U/L (ref 15–41)
Albumin: 4 g/dL (ref 3.5–5.0)
Alkaline Phosphatase: 38 U/L (ref 38–126)
Anion gap: 9 (ref 5–15)
BUN: 16 mg/dL (ref 8–23)
CO2: 21 mmol/L — ABNORMAL LOW (ref 22–32)
Calcium: 9.2 mg/dL (ref 8.9–10.3)
Chloride: 107 mmol/L (ref 98–111)
Creatinine, Ser: 1.02 mg/dL (ref 0.61–1.24)
GFR, Estimated: >60 mL/min (ref >60)
Glucose, Bld: 100 mg/dL — ABNORMAL HIGH (ref 70–99)
Potassium: 3.9 mmol/L (ref 3.5–5.1)
Sodium: 137 mmol/L (ref 135–145)
Total Bilirubin: 0.7 mg/dL (ref 0.3–1.2)
Total Protein: 6.8 g/dL (ref 6.5–8.1)

## 2022-03-18 LAB — ECHOCARDIOGRAM COMPLETE
Area-P 1/2: 2.02 cm2
Calc EF: 49.9 %
S' Lateral: 4.2 cm
Single Plane A2C EF: 48.2 %
Single Plane A4C EF: 52.4 %

## 2022-03-18 LAB — CBC
HCT: 47.4 % (ref 39.0–52.0)
Hemoglobin: 15.5 g/dL (ref 13.0–17.0)
MCH: 28.7 pg (ref 26.0–34.0)
MCHC: 32.7 g/dL (ref 30.0–36.0)
MCV: 87.6 fL (ref 80.0–100.0)
Platelets: 267 10*3/uL (ref 150–400)
RBC: 5.41 MIL/uL (ref 4.22–5.81)
RDW: 12.3 % (ref 11.5–15.5)
WBC: 7 10*3/uL (ref 4.0–10.5)
nRBC: 0 % (ref 0.0–0.2)

## 2022-03-18 NOTE — Patient Instructions (Addendum)
Medication Changes: ? ?None. Continue current medication regimen. ? ?Lab Work: ? ?Labs done today, your results will be available in MyChart, we will contact you for abnormal readings. ? ?Testing/Procedures: ? ?Your physician has requested that you have an echocardiogram. Echocardiography is a painless test that uses sound waves to create images of your heart. It provides your doctor with information about the size and shape of your heart and how well your heart?s chambers and valves are working. This procedure takes approximately one hour. There are no restrictions for this procedure. In one year same day as your follow up appointment. ? ?Referrals: ? ?None. ? ?Special Instructions // Education: ? ?Do the following things EVERYDAY: ?Weigh yourself in the morning before breakfast. Write it down and keep it in a log. ?Take your medicines as prescribed ?Eat low salt foods--Limit salt (sodium) to 2000 mg per day.  ?Stay as active as you can everyday ?Limit all fluids for the day to less than 2 liters ? ? ?Follow-Up in: 12 months with ECHO. **PLEASE CALL OUR OFFICE IN MARCH TO SCHEDULE YOUR FOLLOW UP FOR NEXT MAY, 2024. ? ?At the [facility], you and your health needs are our priority. We have a designated team specialized in the treatment of Heart Failure. This Care Team includes your primary Heart Failure Specialized Cardiologist (physician), Advanced Practice Providers (APPs- Physician Assistants and Nurse Practitioners), and Pharmacist who all work together to provide you with the care you need, when you need it.  ? ?You may see any of the following providers on your designated Care Team at your next follow up: ? ?Dr Glori Bickers ?Dr Loralie Champagne ?Darrick Grinder, NP ?Lyda Jester, PA ?Jessica Milford,NP ?Marlyce Huge, PA ?Audry Riles, PharmD ? ? ?Please be sure to bring in all your medications bottles to every appointment.  ? ?Need to Contact us: ? ?If you have any questions or concerns  before your next appointment please send [place] a message through [place] or call our office at [phone].   ? ?TO LEAVE A MESSAGE FOR THE NURSE SELECT OPTION 2, PLEASE LEAVE A MESSAGE INCLUDING: ?YOUR NAME ?DATE OF BIRTH ?CALL BACK NUMBER ?REASON FOR CALL**this is important as we prioritize the call backs ? ?YOU WILL RECEIVE A CALL BACK THE SAME DAY AS LONG AS YOU CALL BEFORE 4:00 PM ? ? ?

## 2022-03-18 NOTE — Progress Notes (Signed)
?PCP:  ?Primary HF Cardiologist: Dr Haroldine Laws ? ?HPI: ? ?Mr Doane is a 66 year old with history of anxiety, depression, and prostate cancer. Diagnosed with systolic HF EF 3/81. ?  ?Presented with acute HF in 4/22.  LHC showed only minimal CAD. Filling pressures and CO ok on RHC. PVCs noted on tele, but burden not high enough to cause CM. cMRI was performed prior to d/c EF 16%. GDMT initiated. D/C weight 215 pounds.  ? ?Echo 07/05/21 EF 25-30% ? ?Today he returns for HF follow up. Feels great. Walking 15-30 miles per week.(3-4 days per week 5-6 miles at a time). No CP, SOB, orthopnea or PND. Compliant with all meds.  ? ? ?Echo today 03/18/22 EF 50-55% ? ?Cardiac Studies ?Echo 07/05/21 EF 25-30% ?ECHO 02/2021 EF 20-25%  ? ?RHC/LHC 02/22/21 ?Dist LAD lesion is 40% stenosed  ?Findings:  ?Ao =102/71 (84) ?LV = 111/22 ?RA = 2 ?RV = 27/3 ?PA = 27/15 (21) ?PCW = 13 ?Fick cardiac output/index = 6.0/2.6 ?PVR = 1.2 ?SVR = 1086 ?Ao sat = 99% ?PA sat = 73%, 75% ?SVC sat = 81%  ?Assessment: ?1. Minimal non-obstructive CAD ?2. Severe NICM EF 15% ?3. Well-compensated hemodynamics.  ? ?CMRI 02/25/21 ?1. Severely dilated left ventricle, EF 16% with severe global ?hypokinesis.  ?2. Normal right ventricular size with moderately decreased systolic ?function, EF 01%.  ?3. Nonspecific inferior RV insertion site LGE. This suggests ?pressure/volume overload.  ?4. Mildly elevated ECV percentage in septum, not in amyloidosis ?range. ? ? ?ROS: All systems negative except as listed in HPI, PMH and Problem List. ? ?SH:  ?Social History  ? ?Socioeconomic History  ? Marital status: Married  ?  Spouse name: Not on file  ? Number of children: Not on file  ? Years of education: Not on file  ? Highest education level: Not on file  ?Occupational History  ?  Employer: [employer]  ?  Comment: Full Time   ?Tobacco Use  ? Smoking status: Never  ? Smokeless tobacco: Never  ?Substance and Sexual Activity  ? Alcohol use: Not Currently  ? Drug use: Never  ?  Sexual activity: Not on file  ?Other Topics Concern  ? Not on file  ?Social History Narrative  ? Not on file  ? ?Social Determinants of Health  ? ?Financial Resource Strain: Not on file  ?Food Insecurity: Not on file  ?Transportation Needs: Not on file  ?Physical Activity: Not on file  ?Stress: Not on file  ?Social Connections: Not on file  ?Intimate Partner Violence: Not on file  ? ? ?FH:  ?Family History  ?Problem Relation Age of Onset  ? Hypertension Father   ? ? ?Past Medical History:  ?Diagnosis Date  ? Anxiety   ? Hypertension   ? Rhinitis   ? ? ?Current Outpatient Medications  ?Medication Sig Dispense Refill  ? acetaminophen (TYLENOL) 500 MG tablet Take 500 mg by mouth every 6 (six) hours as needed.    ? ALPRAZolam (XANAX) 0.5 MG tablet Take 0.25-0.5 mg by mouth at bedtime as needed for anxiety or sleep.    ? aspirin-acetaminophen-caffeine (EXCEDRIN MIGRAINE) 250-250-65 MG tablet Take 2 tablets by mouth every 6 (six) hours as needed for headache.    ? busPIRone (BUSPAR) 7.5 MG tablet Take 30 mg by mouth at bedtime.    ? carvedilol (COREG) 3.125 MG tablet TAKE 1 TABLET TWICE A DAY 60 tablet 11  ? clonazePAM (KLONOPIN) 1 MG tablet Take 1 mg by mouth at bedtime.    ?  DULoxetine (CYMBALTA) 30 MG capsule Take 90 mg by mouth at bedtime.    ? famotidine (PEPCID) 20 MG tablet Take 20 mg by mouth daily as needed.    ? fluticasone (FLONASE) 50 MCG/ACT nasal spray Place 2 sprays into both nostrils at bedtime.    ? Guaifenesin (MUCINEX MAXIMUM STRENGTH) 1200 MG TB12 Take 1,200 mg by mouth at bedtime.    ? JARDIANCE 10 MG TABS tablet TAKE 1 TABLET DAILY BEFORE BREAKFAST 90 tablet 3  ? montelukast (SINGULAIR) 10 MG tablet Take 10 mg by mouth at bedtime.    ? Omega-3 Fatty Acids (FISH OIL) 1000 MG CAPS Take 1,000 mg by mouth.    ? polyethylene glycol (MIRALAX / GLYCOLAX) 17 g packet Take 17 g by mouth daily as needed.    ? Probiotic Product (PROBIOTIC-10 ULTIMATE PO) Take by mouth. Every other day    ? Psyllium (METAMUCIL  PO) Patient takes 1 teaspoon by mouth daily in a glass of water.    ? rosuvastatin (CRESTOR) 10 MG tablet Take 1 tablet (10 mg total) by mouth daily. 90 tablet 3  ? sacubitril-valsartan (ENTRESTO) 49-51 MG Take 1 tablet by mouth 2 (two) times daily. 180 tablet 3  ? spironolactone (ALDACTONE) 25 MG tablet TAKE 1 TABLET DAILY 30 tablet 11  ? tadalafil (CIALIS) 5 MG tablet Take 5 mg by mouth as needed for erectile dysfunction.    ? ?No current facility-administered medications for this encounter.  ? ?BP 110/76   Pulse 83   Wt 87.6 kg (193 lb 3.2 oz)   SpO2 96%   BMI 24.81 kg/m?  ? ?Wt Readings from Last 3 Encounters:  ?03/18/22 87.6 kg (193 lb 3.2 oz)  ?11/05/21 88.5 kg (195 lb 3.2 oz)  ?07/05/21 84.6 kg (186 lb 6.4 oz)  ? ?PHYSICAL EXAM: ?General:  Well appearing. No resp difficulty ?HEENT: normal ?Neck: supple. no JVD. Carotids 2+ bilat; no bruits. No lymphadenopathy or thryomegaly appreciated. ?Cor: PMI nondisplaced. Regular rate & rhythm. No rubs, gallops or murmurs. ?Lungs: clear ?Abdomen: soft, nontender, nondistended. No hepatosplenomegaly. No bruits or masses. Good bowel sounds. ?Extremities: no cyanosis, clubbing, rash, edema ?Neuro: alert & orientedx3, cranial nerves grossly intact. moves all 4 extremities w/o difficulty. Affect pleasant ? ?ECG: NSR 83 LAFB No ST-T wave abnormalities. Personally reviewed ? ? ?ASSESSMENT & PLAN: ? ?1. Chronic systolic HF: Nonischemic cardiomyopathy, uncertain etiology.   ?- Echo 4/22 with EF 20-25% RV mildly reduced. LHC minimal CAD.  Filling pressures and CO ok on RHC.  ?- cMRI 4/22- EF 16% RV 30% LGE at RV insertion site  ?- Echo 07/05/21 EF ~30%, not referred for ICD with stable NYHA I symptoms ?- Echo today 03/18/22 EF 50-55% ?- Doing great NYHA I ?- Continue spironolactone 25 daily.  ?- Continue Entresto 49/51 mg bid. ?- Continue Jardiance 10 mg daily.  ?- Continue Coreg 3.125 mg bid. ?  ?2. PVCs ?- Hospitalization 4/22 not felt to be high burden to explain  cardiomyopathy.  ?- Continue Coreg. ?- Sleep study 6/22 AHI 4.4 ?  ?3. HTN  ?- Blood pressure well controlled. Continue current regimen. ? ?4. HL ?- continue rosuvastatin ? ?  ?Jedidiah Demartini BensimhonMD ?3:52 PM ? ?

## 2022-03-19 ENCOUNTER — Other Ambulatory Visit (HOSPITAL_COMMUNITY): Payer: Self-pay

## 2022-03-19 MED ORDER — ROSUVASTATIN CALCIUM 10 MG PO TABS: 10.0000 mg | ORAL_TABLET | Freq: Every day | ORAL | 0 refills | Status: DC

## 2022-04-04 IMAGING — MR MR CARD MORPHOLOGY WO/W CM
45 of 48 series · 45 of 48 positions shown · IV contrast (Contrast agent)
Comparison: none

CLINICAL DATA: Cardiomyopathy of uncertain etiology

EXAM:
CARDIAC MRI
TECHNIQUE: The patient was scanned on a 1.5 Tesla GE magnet. A dedicated
cardiac coil was used. Functional imaging was done using Fiesta
sequences. [DATE], and 4 chamber views were done to assess for RWMA's.
Modified Bemba rule using a short axis stack was used to
calculate an ejection fraction on a dedicated work station using
Circle software. The patient received 10 cc of Multihance. After 10
minutes inversion recovery sequences were used to assess for
infiltration and scar tissue.
CONTRAST:  Gadavist 10 cc

[Series 4: [id number] · axial · 8.0mm · 1.48mm/px · 1 of 18 slices shown]
[im 1/18]
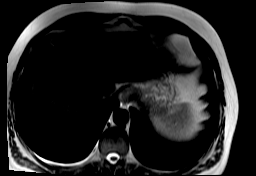

[Series 8: bSSFP · oblique · 8.0mm · 1.61mm/px · 1 of 25 slices shown (1 of 23)]
[im 1/25]
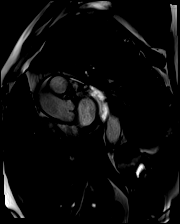

[Series 9: bSSFP · oblique · 8.0mm · 1.61mm/px · 1 of 25 slices shown (2 of 23)]
[im 1/25]
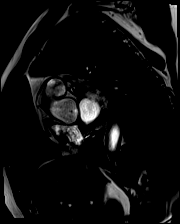

[Series 10: bSSFP · oblique · 8.0mm · 1.61mm/px · 1 of 25 slices shown (3 of 23)]
[im 1/25]
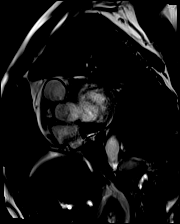

[Series 11: bSSFP · oblique · 8.0mm · 1.61mm/px · 1 of 25 slices shown (4 of 23)]
[im 1/25]
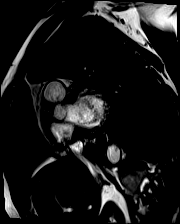

[Series 12: bSSFP · oblique · 8.0mm · 1.61mm/px · 1 of 25 slices shown (5 of 23)]
[im 1/25]
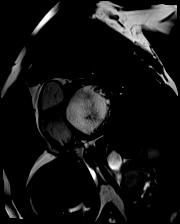

[Series 13: bSSFP · oblique · 8.0mm · 1.61mm/px · 1 of 25 slices shown (6 of 23)]
[im 1/25]
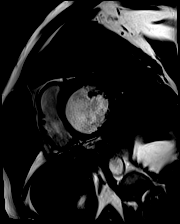

[Series 14: bSSFP · oblique · 8.0mm · 1.61mm/px · 1 of 25 slices shown (7 of 23)]
[im 1/25]
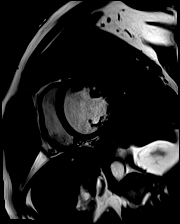

[Series 15: bSSFP · oblique · 8.0mm · 1.61mm/px · 1 of 25 slices shown (8 of 23)]
[im 1/25]
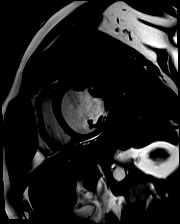

[Series 16: bSSFP · oblique · 8.0mm · 1.61mm/px · 1 of 25 slices shown (9 of 23)]
[im 1/25]
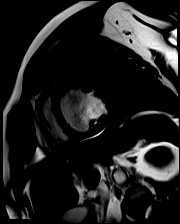

[Series 17: bSSFP · oblique · 8.0mm · 1.61mm/px · 1 of 25 slices shown (10 of 23)]
[im 1/25]
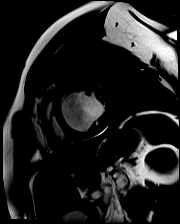

[Series 18: bSSFP · oblique · 8.0mm · 1.61mm/px · 1 of 25 slices shown (11 of 23)]
[im 1/25]
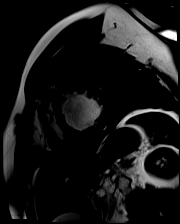

[Series 19: bSSFP · oblique · 8.0mm · 1.61mm/px · 1 of 25 slices shown (12 of 23)]
[im 1/25]
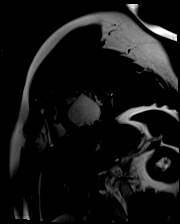

[Series 20: bSSFP · oblique · 8.0mm · 1.61mm/px · 1 of 25 slices shown (13 of 23)]
[im 1/25]
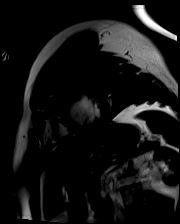

[Series 21: bSSFP · oblique · 8.0mm · 1.61mm/px · 1 of 25 slices shown (14 of 23)]
[im 1/25]
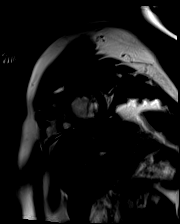

[Series 22: bSSFP · oblique · 8.0mm · 1.61mm/px · 1 of 25 slices shown (15 of 23)]
[im 1/25]
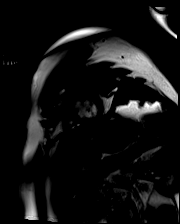

[Series 23: bSSFP · oblique · 8.0mm · 1.61mm/px · 1 of 25 slices shown (16 of 23)]
[im 1/25]
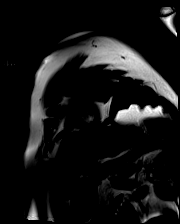

[Series 24: bSSFP · oblique · 8.0mm · 1.61mm/px · 1 of 25 slices shown (17 of 23)]
[im 1/25]
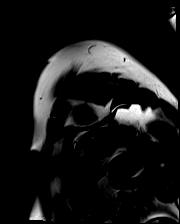

[Series 25: bSSFP · oblique · 8.0mm · 1.61mm/px · 1 of 25 slices shown (18 of 23)]
[im 1/25]
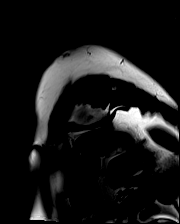

[Series 26: bSSFP · oblique · 8.0mm · 1.61mm/px · 1 of 25 slices shown (19 of 23)]
[im 1/25]
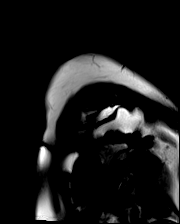

[Series 27: bSSFP · oblique · 6.0mm · 1.41mm/px · 1 of 25 slices shown (20 of 23)]
[im 1/25]
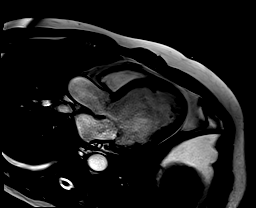

[Series 28: bSSFP · oblique · 6.0mm · 1.41mm/px · 1 of 11 slices shown (21 of 23)]
[im 1/11]
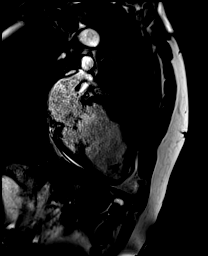

[Series 29: bSSFP · oblique · 6.0mm · 1.41mm/px · 1 of 14 slices shown (22 of 23)]
[im 1/14]
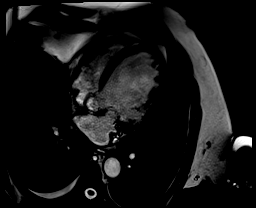

[Series 30: (id)_long_t1 · oblique · 8.0mm · 1.56mm/px · 1 of 24 slices shown]
[im 1/24]
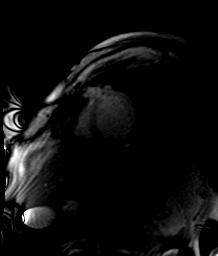

[Series 31: (id)_long_t1_moco · oblique · 8.0mm · 1.56mm/px · 1 of 24 slices shown]
[im 1/24]
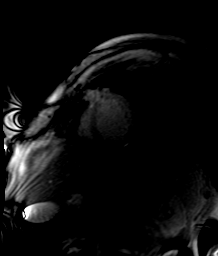

[Series 32: (id)_long_t1_moco_t1 · oblique · 8.0mm · 1.56mm/px · 1 of 6 slices shown]
[im 1/6]
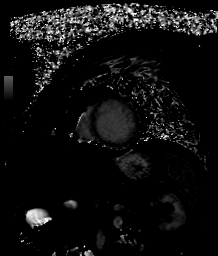

[Series 34: bSSFP · oblique · 6.0mm · 1.41mm/px · 1 of 14 slices shown (23 of 23)]
[im 1/14]
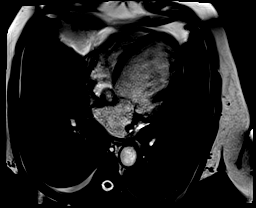

[Series 35: (id)_trufi · oblique · 8.0mm · 2.08mm/px · 1 of 9 slices shown]
[im 1/9]
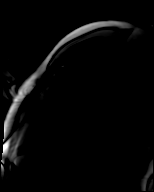

[Series 36: (id)_trufi_moco · oblique · 8.0mm · 2.08mm/px · 1 of 9 slices shown]
[im 1/9]
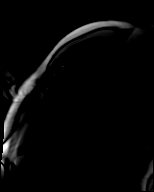

[Series 37: (id)_trufi_moco_t2 · oblique · 8.0mm · 2.08mm/px · 1 of 3 slices shown]
[im 1/3]
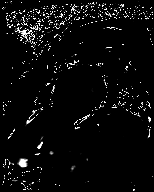

[Series 39: STIR · oblique · 8.0mm · 2.02mm/px · 1 of 17 slices shown]
[im 1/17]
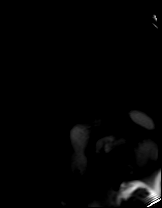

[Series 40: pre short axis · oblique · non-contrast · 8.0mm · 2.25mm/px · 1 of 10 slices shown (1 of 6)]
[im 1/10]
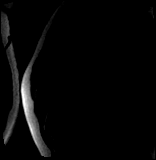

[Series 41: pre short axis · oblique · non-contrast · 8.0mm · 2.25mm/px · 1 of 10 slices shown (2 of 6)]
[im 1/10]
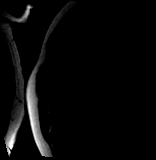

[Series 42: pre short axis · oblique · non-contrast · 8.0mm · 2.25mm/px · 1 of 10 slices shown (3 of 6)]
[im 1/10]
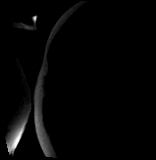

[Series 43: pre short axis · oblique · non-contrast · 8.0mm · 2.25mm/px · 1 of 10 slices shown (4 of 6)]
[im 1/10]
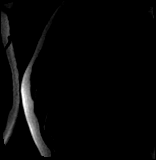

[Series 44: pre short axis · oblique · non-contrast · 8.0mm · 2.25mm/px · 1 of 10 slices shown (5 of 6)]
[im 1/10]
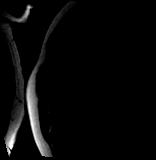

[Series 45: pre short axis · oblique · non-contrast · 8.0mm · 2.25mm/px · 1 of 10 slices shown (6 of 6)]
[im 1/10]
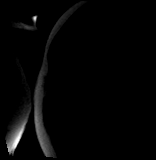

[Series 46: rest short axis · oblique · 8.0mm · 2.25mm/px · 1 of 5 slices shown (1 of 8)]
[im 1/5]
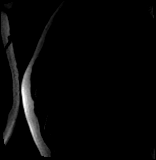

[Series 47: rest short axis · oblique · 8.0mm · 2.25mm/px · 1 of 5 slices shown (2 of 8)]
[im 1/5]
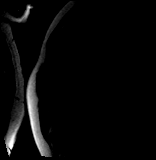

[Series 48: rest short axis · oblique · 8.0mm · 2.25mm/px · 1 of 5 slices shown (3 of 8)]
[im 1/5]
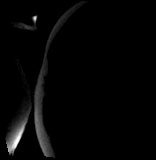

[Series 49: rest short axis · oblique · 8.0mm · 2.25mm/px · 1 of 60 slices shown (4 of 8)]
[im 1/60]
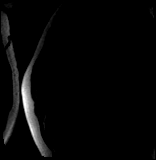

[Series 50: rest short axis · oblique · 8.0mm · 2.25mm/px · 1 of 60 slices shown (5 of 8)]
[im 1/60]
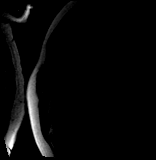

[Series 51: rest short axis · oblique · 8.0mm · 2.25mm/px · 1 of 60 slices shown (6 of 8)]
[im 1/60]
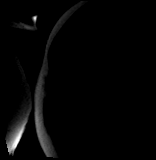

[Series 52: rest short axis · oblique · 8.0mm · 2.25mm/px · 1 of 60 slices shown (7 of 8)]
[im 1/60]
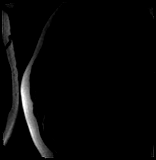

[Series 53: rest short axis · oblique · 8.0mm · 2.25mm/px · 1 of 60 slices shown (8 of 8)]
[im 1/60]
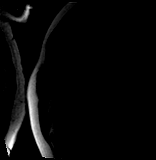

[45 of 48 positions shown; findings below may reference images not displayed]

FINDINGS: Limited images of the lung fields showed no gross abnormalities.

Severely dilated left ventricle with normal wall thickness. No
evidence for LV noncompaction. No LV thrombus noted. Diffuse LV
hypokinesis, EF 16%. Normal right ventricular size, moderate
systolic dysfunction with EF 30%. Mild left atrial enlargement.
Normal right atrium. Mild mitral regurgitation. Trileaflet aortic
valve with no significant stenosis, trivial regurgitation.

On delayed enhancement imaging, there was subepicardial late
gadolinium enhancement (LGE) at the basal inferoseptal RV insertion
site.

Measurements:

LVEDV 382 mL
LVSV 61 mL
LVEF 16%

RVEDV 168 mL
RVSV 51 mL
RVEF 30%

ECV 36% septum
IMPRESSION: 1. Severely dilated left ventricle, EF 16% with severe global
hypokinesis.

2. Normal right ventricular size with moderately decreased systolic
function, EF 30%.

3. Nonspecific inferior RV insertion site LGE. This suggests
pressure/volume overload.

4. Mildly elevated ECV percentage in septum, not in amyloidosis
range.

Beyye Reubens

## 2022-06-19 ENCOUNTER — Other Ambulatory Visit (HOSPITAL_COMMUNITY): Payer: Self-pay | Admitting: Internal Medicine

## 2022-06-26 ENCOUNTER — Other Ambulatory Visit (HOSPITAL_COMMUNITY): Payer: Self-pay | Admitting: Family Medicine

## 2022-06-26 DIAGNOSIS — C61 Malignant neoplasm of prostate: Secondary | ICD-10-CM

## 2022-07-04 ENCOUNTER — Ambulatory Visit (HOSPITAL_COMMUNITY)
Admission: RE | Admit: 2022-07-04 | Discharge: 2022-07-04 | Disposition: A | Discharge status: Home / Self Care | Payer: Medicare Other | Source: Ambulatory Visit | Attending: Family Medicine | Admitting: Family Medicine

## 2022-07-04 DIAGNOSIS — C61 Malignant neoplasm of prostate: Secondary | ICD-10-CM | POA: Diagnosis present

## 2022-07-04 MED ORDER — PIFLIFOLASTAT F 18 (PYLARIFY) INJECTION
9.0000 | Freq: Once | INTRAVENOUS | Status: AC
  Administered 2022-07-04: 8.19 via INTRAVENOUS

## 2023-01-23 ENCOUNTER — Other Ambulatory Visit (HOSPITAL_COMMUNITY): Payer: Self-pay | Admitting: Internal Medicine

## 2023-01-24 ENCOUNTER — Other Ambulatory Visit (HOSPITAL_COMMUNITY): Payer: Self-pay | Admitting: Internal Medicine

## 2023-02-11 ENCOUNTER — Other Ambulatory Visit (HOSPITAL_COMMUNITY): Payer: Self-pay | Admitting: Cardiology

## 2023-02-11 DIAGNOSIS — I5022 Chronic systolic (congestive) heart failure: Secondary | ICD-10-CM

## 2023-04-06 ENCOUNTER — Other Ambulatory Visit (HOSPITAL_COMMUNITY): Payer: Self-pay | Admitting: Internal Medicine

## 2023-04-14 ENCOUNTER — Other Ambulatory Visit: Payer: Self-pay

## 2023-04-14 MED ORDER — EMPAGLIFLOZIN 10 MG PO TABS: 10.0000 mg | ORAL_TABLET | Freq: Every day | ORAL | 0 refills | Status: DC

## 2023-04-14 MED ORDER — SPIRONOLACTONE 25 MG PO TABS: 25.0000 mg | ORAL_TABLET | Freq: Every day | ORAL | 0 refills | Status: DC

## 2023-04-15 NOTE — Progress Notes (Signed)
PCP:  Primary HF Cardiologist: Dr Gala Romney  HPI:  Neil Weber is a 67 year old with history of anxiety, depression, and prostate cancer. Diagnosed with systolic HF EF 4/22.   Presented with acute HF in 4/22.  LHC showed only minimal CAD. Filling pressures and CO ok on RHC. PVCs noted on tele, but burden not high enough to cause CM. cMRI was performed prior to d/c EF 16%. GDMT initiated. D/C weight 215 pounds.   Echo 07/05/21 EF 25-30%  Today he returns for HF follow up. We have not seen him in a year. Feels great. Walking 15-30 miles per week.(3-4 days per week 5-6 miles at a time). No CP, SOB, orthopnea or PND. Compliant with all meds.   Echo  03/18/22 EF 50-55%  Cardiac Studies Echo 07/05/21 EF 25-30% ECHO 02/2021 EF 20-25%   RHC/LHC 02/22/21 Dist LAD lesion is 40% stenosed  Findings:  Ao =102/71 (84) LV = 111/22 RA = 2 RV = 27/3 PA = 27/15 (21) PCW = 13 Fick cardiac output/index = 6.0/2.6 PVR = 1.2 SVR = 1086 Ao sat = 99% PA sat = 73%, 75% SVC sat = 81%  Assessment: 1. Minimal non-obstructive CAD 2. 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 Well-compensated hemodynamics.   CMRI 02/25/21 1. Severely dilated left ventricle, EF 16% with severe global hypokinesis.  2. Normal right ventricular size with moderately decreased systolic function, EF 30%.  3. Nonspecific inferior RV insertion site LGE. This suggests pressure/volume overload.  4. Mildly elevated ECV percentage in septum, not in amyloidosis range.   ROS: All systems negative except as listed in HPI, PMH and Problem List.  SH:  Social History   Socioeconomic History   Marital status: Married    Spouse name: Not on file   Number of children: Not on file   Years of education: Not on file   Highest education level: Not on file  Occupational History    Employer: [employer]    Comment: Full Time   Tobacco Use   Smoking status: Never   Smokeless tobacco: Never  Substance and Sexual Activity   Alcohol use: Not Currently    Drug use: Never   Sexual activity: Not on file  Other Topics Concern   Not on file  Social History Narrative   Not on file   Social Determinants of Health   Financial Resource Strain: Not on file  Food Insecurity: Not on file  Transportation Needs: Not on file  Physical Activity: Not on file  Stress: Not on file  Social Connections: Not on file  Intimate Partner Violence: Not on file    FH:  Family History  Problem Relation Age of Onset   Hypertension Father     Past Medical History:  Diagnosis Date   Anxiety    Hypertension    Rhinitis     Current Outpatient Medications  Medication Sig Dispense Refill   acetaminophen (TYLENOL) 500 MG tablet Take 500 mg by mouth every 6 (six) hours as needed.     ALPRAZolam (XANAX) 0.5 MG tablet Take 0.25-0.5 mg by mouth at bedtime as needed for anxiety or sleep.     aspirin-acetaminophen-caffeine (EXCEDRIN MIGRAINE) 250-250-65 MG tablet Take 2 tablets by mouth every 6 (six) hours as needed for headache.     busPIRone (BUSPAR) 7.5 MG tablet Take 30 mg by mouth at bedtime.     carvedilol (COREG) 3.125 MG tablet TAKE 1 TABLET BY MOUTH TWICE  DAILY 180 tablet 3   clonazePAM (KLONOPIN) 1  MG tablet Take 1 mg by mouth at bedtime.     DULoxetine (CYMBALTA) 30 MG capsule Take 90 mg by mouth at bedtime.     empagliflozin (JARDIANCE) 10 MG TABS tablet Take 1 tablet (10 mg total) by mouth daily before breakfast. 90 tablet 0   famotidine (PEPCID) 20 MG tablet Take 20 mg by mouth daily as needed.     fluticasone (FLONASE) 50 MCG/ACT nasal spray Place 2 sprays into both nostrils at bedtime.     Guaifenesin (MUCINEX MAXIMUM STRENGTH) 1200 MG TB12 Take 1,200 mg by mouth at bedtime.     montelukast (SINGULAIR) 10 MG tablet Take 10 mg by mouth at bedtime.     Omega-3 Fatty Acids (FISH OIL) 1000 MG CAPS Take 1,000 mg by mouth.     polyethylene glycol (MIRALAX / GLYCOLAX) 17 g packet Take 17 g by mouth daily as needed.     Probiotic Product  (PROBIOTIC-10 ULTIMATE PO) Take by mouth. Every other day     Psyllium (METAMUCIL PO) Patient takes 1 teaspoon by mouth daily in a glass of water.     rosuvastatin (CRESTOR) 10 MG tablet TAKE 1 TABLET DAILY 90 tablet 3   sacubitril-valsartan (ENTRESTO) 49-51 MG TAKE 1 TABLET BY MOUTH TWICE  DAILY (CHANGE IN DOSAGE OR PILL  SIZE) 180 tablet 0   spironolactone (ALDACTONE) 25 MG tablet Take 1 tablet (25 mg total) by mouth daily. 90 tablet 0   tadalafil (CIALIS) 5 MG tablet Take 5 mg by mouth as needed for erectile dysfunction.     No current facility-administered medications for this encounter.   There were no vitals taken for this visit.  Wt Readings from Last 3 Encounters:  03/18/22 87.6 kg (193 lb 3.2 oz)  11/05/21 88.5 kg (195 lb 3.2 oz)  07/05/21 84.6 kg (186 lb 6.4 oz)   PHYSICAL EXAM: General:  Well appearing. No resp difficulty HEENT: normal Neck: supple. no JVD. Carotids 2+ bilat; no bruits. No lymphadenopathy or thryomegaly appreciated. Cor: PMI nondisplaced. Regular rate & rhythm. No rubs, gallops or murmurs. Lungs: clear Abdomen: soft, nontender, nondistended. No hepatosplenomegaly. No bruits or masses. Good bowel sounds. Extremities: no cyanosis, clubbing, rash, edema Neuro: alert & orientedx3, cranial nerves grossly intact. moves all 4 extremities w/o difficulty. Affect pleasant  ECG: NSR 83 LAFB No ST-T wave abnormalities. Personally reviewed   ASSESSMENT & PLAN:  1. Chronic systolic HF: Nonischemic cardiomyopathy, uncertain etiology.   - Echo 4/22 with EF 20-25% RV mildly reduced. LHC minimal CAD.  Filling pressures and CO ok on RHC.  - cMRI 4/22- EF 16% RV 30% LGE at RV insertion site  - Echo 07/05/21 EF ~30%, not referred for ICD with stable NYHA I symptoms - Echo 03/18/22 EF 50-55% - Doing great NYHA I - Continue spironolactone 25 daily.  - Continue Entresto 49/51 mg bid. - Continue Jardiance 10 mg daily.  - Continue Coreg 3.125 mg bid.   2. PVCs - Not felt to  be high burden to explain cardiomyopathy.  - Continue Coreg. - Sleep study 6/22 AHI 4.4   3. HTN  - Blood pressure well controlled. Continue current regimen.  4. HL - continue rosuvastatin    Lorynn Moeser BensimhonMD 9:11 PM

## 2023-04-16 ENCOUNTER — Ambulatory Visit (HOSPITAL_COMMUNITY)
Admission: RE | Admit: 2023-04-16 | Discharge: 2023-04-16 | Disposition: A | Discharge status: Home / Self Care | Payer: Medicare Other | Source: Ambulatory Visit | Attending: Internal Medicine | Admitting: Internal Medicine

## 2023-04-16 ENCOUNTER — Encounter (HOSPITAL_COMMUNITY): Payer: Self-pay | Admitting: Internal Medicine

## 2023-04-16 ENCOUNTER — Ambulatory Visit (HOSPITAL_BASED_OUTPATIENT_CLINIC_OR_DEPARTMENT_OTHER)
Admission: RE | Admit: 2023-04-16 | Discharge: 2023-04-16 | Disposition: A | Discharge status: Home / Self Care | Payer: Medicare Other | Source: Ambulatory Visit | Attending: Internal Medicine | Admitting: Internal Medicine

## 2023-04-16 ENCOUNTER — Other Ambulatory Visit: Payer: Self-pay

## 2023-04-16 VITALS — BP 110/70 | HR 73 | Wt 206.0 lb

## 2023-04-16 DIAGNOSIS — Z79899 Other long term (current) drug therapy: Secondary | ICD-10-CM | POA: Diagnosis not present

## 2023-04-16 DIAGNOSIS — E782 Mixed hyperlipidemia: Secondary | ICD-10-CM

## 2023-04-16 DIAGNOSIS — I493 Ventricular premature depolarization: Secondary | ICD-10-CM

## 2023-04-16 DIAGNOSIS — I351 Nonrheumatic aortic (valve) insufficiency: Secondary | ICD-10-CM | POA: Diagnosis not present

## 2023-04-16 DIAGNOSIS — F32A Depression, unspecified: Secondary | ICD-10-CM | POA: Diagnosis not present

## 2023-04-16 DIAGNOSIS — I428 Other cardiomyopathies: Secondary | ICD-10-CM | POA: Diagnosis not present

## 2023-04-16 DIAGNOSIS — Z8546 Personal history of malignant neoplasm of prostate: Secondary | ICD-10-CM | POA: Insufficient documentation

## 2023-04-16 DIAGNOSIS — I5022 Chronic systolic (congestive) heart failure: Secondary | ICD-10-CM

## 2023-04-16 DIAGNOSIS — Z7984 Long term (current) use of oral hypoglycemic drugs: Secondary | ICD-10-CM | POA: Insufficient documentation

## 2023-04-16 DIAGNOSIS — I110 Hypertensive heart disease with heart failure: Secondary | ICD-10-CM | POA: Insufficient documentation

## 2023-04-16 DIAGNOSIS — E785 Hyperlipidemia, unspecified: Secondary | ICD-10-CM | POA: Insufficient documentation

## 2023-04-16 DIAGNOSIS — F419 Anxiety disorder, unspecified: Secondary | ICD-10-CM | POA: Diagnosis not present

## 2023-04-16 DIAGNOSIS — I2510 Atherosclerotic heart disease of native coronary artery without angina pectoris: Secondary | ICD-10-CM | POA: Diagnosis not present

## 2023-04-16 DIAGNOSIS — I10 Essential (primary) hypertension: Secondary | ICD-10-CM | POA: Diagnosis not present

## 2023-04-16 LAB — BASIC METABOLIC PANEL
Anion gap: 13 (ref 5–15)
BUN: 16 mg/dL (ref 8–23)
CO2: 23 mmol/L (ref 22–32)
Calcium: 9.7 mg/dL (ref 8.9–10.3)
Chloride: 101 mmol/L (ref 98–111)
Creatinine, Ser: 0.96 mg/dL (ref 0.61–1.24)
GFR, Estimated: >60 mL/min (ref >60)
Glucose, Bld: 92 mg/dL (ref 70–99)
Potassium: 4.2 mmol/L (ref 3.5–5.1)
Sodium: 137 mmol/L (ref 135–145)

## 2023-04-16 LAB — BRAIN NATRIURETIC PEPTIDE: B Natriuretic Peptide: 8.7 pg/mL (ref 0.0–100.0)

## 2023-04-16 LAB — ECHOCARDIOGRAM COMPLETE
AR max vel: 1.94 cm2
AV Area VTI: 2.14 cm2
AV Area mean vel: 2.22 cm2
AV Mean grad: 4 mmHg
AV Peak grad: 6.2 mmHg
Ao pk vel: 1.24 m/s
Area-P 1/2: 3.6 cm2
Calc EF: 50 %
S' Lateral: 3.5 cm
Single Plane A2C EF: 48.3 %
Single Plane A4C EF: 52.1 %

## 2023-04-16 MED ORDER — SPIRONOLACTONE 25 MG PO TABS: 25.0000 mg | ORAL_TABLET | Freq: Every day | ORAL | 3 refills | Status: DC

## 2023-04-16 MED ORDER — ROSUVASTATIN CALCIUM 10 MG PO TABS: 10.0000 mg | ORAL_TABLET | Freq: Every day | ORAL | 3 refills | Status: DC

## 2023-04-16 MED ORDER — ENTRESTO 24-26 MG PO TABS: 1.0000 | ORAL_TABLET | Freq: Two times a day (BID) | ORAL | 3 refills | Status: DC

## 2023-04-16 MED ORDER — EMPAGLIFLOZIN 10 MG PO TABS: 10.0000 mg | ORAL_TABLET | Freq: Every day | ORAL | 3 refills | Status: DC

## 2023-04-16 NOTE — Addendum Note (Signed)
Encounter addended by: Noralee Space, RN on: 04/16/2023 2:26 PM  Actions taken: Order list changed, Diagnosis association updated, Clinical Note Signed, Charge Capture section accepted

## 2023-04-16 NOTE — Patient Instructions (Addendum)
Medication Changes:  Decrease Entresto to 24/26 mg Twice daily   Lab Work:  Labs done today, your results will be available in MyChart, we will contact you for abnormal readings.  Testing/Procedures:  Your physician has requested that you have an echocardiogram. Echocardiography is a painless test that uses sound waves to create images of your heart. It provides your doctor with information about the size and shape of your heart and how well your heart's chambers and valves are working. This procedure takes approximately one hour. There are no restrictions for this procedure. Please do NOT wear cologne, perfume, aftershave, or lotions (deodorant is allowed). Please arrive 15 minutes prior to your appointment time. IN 1 YEAR  Referrals:  none  Special Instructions // Education:  Do the following things EVERYDAY: Weigh yourself in the morning before breakfast. Write it down and keep it in a log. Take your medicines as prescribed Eat low salt foods--Limit salt (sodium) to 2000 mg per day.  Stay as active as you can everyday Limit all fluids for the day to less than 2 liters   Follow-Up in: 1 year with an echocardiogram (May 2025), **PLEASE CALL OUR OFFICE IN MARCH 2025 TO SCHEDULE THESE APPOINTMENTS   At the [facility], you and your health needs are our priority. We have a designated team specialized in the treatment of Heart Failure. This Care Team includes your primary Heart Failure Specialized Cardiologist (physician), Advanced Practice Providers (APPs- Physician Assistants and Nurse Practitioners), and Pharmacist who all work together to provide you with the care you need, when you need it.   You may see any of the following providers on your designated Care Team at your next follow up:  Dr. Arvilla Meres Dr. Marca Ancona Dr. Marcos Eke, NP Robbie Lis, [place] [facility] [place], [place] Brynda Peon, NP Karle Plumber,  PharmD   Please be sure to bring in all your medications bottles to every appointment.   Need to Contact us:  If you have any questions or concerns before your next appointment please send [place] a message through [place] or call our office at [phone].    TO LEAVE A MESSAGE FOR THE NURSE SELECT OPTION 2, PLEASE LEAVE A MESSAGE INCLUDING: YOUR NAME DATE OF BIRTH CALL BACK NUMBER REASON FOR CALL**this is important as we prioritize the call backs  YOU WILL RECEIVE A CALL BACK THE SAME DAY AS LONG AS YOU CALL BEFORE 4:00 PM

## 2023-04-16 NOTE — Addendum Note (Signed)
Encounter addended by: Noralee Space, RN on: 04/16/2023 2:50 PM  Actions taken: Order list changed, Clinical Note Signed

## 2023-07-11 ENCOUNTER — Other Ambulatory Visit: Payer: Self-pay | Admitting: Internal Medicine

## 2023-07-27 ENCOUNTER — Other Ambulatory Visit (HOSPITAL_COMMUNITY): Payer: Self-pay | Admitting: Family Medicine

## 2023-07-27 DIAGNOSIS — C61 Malignant neoplasm of prostate: Secondary | ICD-10-CM

## 2023-08-04 ENCOUNTER — Ambulatory Visit (HOSPITAL_COMMUNITY)
Admission: RE | Admit: 2023-08-04 | Discharge: 2023-08-04 | Disposition: A | Discharge status: Home / Self Care | Payer: Medicare Other | Source: Ambulatory Visit | Attending: Family Medicine | Admitting: Family Medicine

## 2023-08-04 DIAGNOSIS — C61 Malignant neoplasm of prostate: Secondary | ICD-10-CM | POA: Diagnosis present

## 2023-08-04 MED ORDER — FLOTUFOLASTAT F 18 GALLIUM 296-5846 MBQ/ML IV SOLN
8.0000 | Freq: Once | INTRAVENOUS | Status: AC
  Administered 2023-08-04: 7.91 via INTRAVENOUS
  Filled 2023-08-04: qty 8

## 2023-12-31 ENCOUNTER — Other Ambulatory Visit (HOSPITAL_COMMUNITY): Payer: Self-pay | Admitting: Internal Medicine

## 2024-03-16 ENCOUNTER — Other Ambulatory Visit (HOSPITAL_COMMUNITY): Payer: Self-pay | Admitting: Internal Medicine

## 2024-03-22 ENCOUNTER — Other Ambulatory Visit (HOSPITAL_COMMUNITY): Payer: Self-pay | Admitting: Internal Medicine

## 2024-03-23 ENCOUNTER — Other Ambulatory Visit (HOSPITAL_COMMUNITY): Payer: Self-pay | Admitting: Internal Medicine

## 2024-04-27 ENCOUNTER — Ambulatory Visit (HOSPITAL_BASED_OUTPATIENT_CLINIC_OR_DEPARTMENT_OTHER)
Admission: RE | Admit: 2024-04-27 | Discharge: 2024-04-27 | Disposition: A | Discharge status: Home / Self Care | Source: Ambulatory Visit | Attending: Family Medicine | Admitting: Family Medicine

## 2024-04-27 ENCOUNTER — Encounter (HOSPITAL_COMMUNITY): Payer: Self-pay

## 2024-04-27 ENCOUNTER — Ambulatory Visit (HOSPITAL_COMMUNITY)
Admission: RE | Admit: 2024-04-27 | Discharge: 2024-04-27 | Disposition: A | Discharge status: Home / Self Care | Source: Ambulatory Visit | Attending: Internal Medicine | Admitting: Internal Medicine

## 2024-04-27 ENCOUNTER — Ambulatory Visit (HOSPITAL_COMMUNITY): Payer: Self-pay | Admitting: Family Medicine

## 2024-04-27 VITALS — BP 122/68 | HR 102 | Ht 74.0 in | Wt 215.8 lb

## 2024-04-27 DIAGNOSIS — I493 Ventricular premature depolarization: Secondary | ICD-10-CM | POA: Insufficient documentation

## 2024-04-27 DIAGNOSIS — I10 Essential (primary) hypertension: Secondary | ICD-10-CM

## 2024-04-27 DIAGNOSIS — E785 Hyperlipidemia, unspecified: Secondary | ICD-10-CM | POA: Insufficient documentation

## 2024-04-27 DIAGNOSIS — I428 Other cardiomyopathies: Secondary | ICD-10-CM | POA: Insufficient documentation

## 2024-04-27 DIAGNOSIS — F419 Anxiety disorder, unspecified: Secondary | ICD-10-CM | POA: Insufficient documentation

## 2024-04-27 DIAGNOSIS — Z79899 Other long term (current) drug therapy: Secondary | ICD-10-CM | POA: Insufficient documentation

## 2024-04-27 DIAGNOSIS — I5022 Chronic systolic (congestive) heart failure: Secondary | ICD-10-CM | POA: Diagnosis present

## 2024-04-27 DIAGNOSIS — F32A Depression, unspecified: Secondary | ICD-10-CM | POA: Insufficient documentation

## 2024-04-27 DIAGNOSIS — I110 Hypertensive heart disease with heart failure: Secondary | ICD-10-CM | POA: Diagnosis not present

## 2024-04-27 DIAGNOSIS — E782 Mixed hyperlipidemia: Secondary | ICD-10-CM

## 2024-04-27 DIAGNOSIS — Z8546 Personal history of malignant neoplasm of prostate: Secondary | ICD-10-CM | POA: Diagnosis not present

## 2024-04-27 LAB — BASIC METABOLIC PANEL WITH GFR
Anion gap: 8 (ref 5–15)
BUN: 18 mg/dL (ref 8–23)
CO2: 24 mmol/L (ref 22–32)
Calcium: 10.1 mg/dL (ref 8.9–10.3)
Chloride: 105 mmol/L (ref 98–111)
Creatinine, Ser: 0.89 mg/dL (ref 0.61–1.24)
GFR, Estimated: >60 mL/min (ref >60)
Glucose, Bld: 98 mg/dL (ref 70–99)
Potassium: 4.1 mmol/L (ref 3.5–5.1)
Sodium: 137 mmol/L (ref 135–145)

## 2024-04-27 LAB — ECHOCARDIOGRAM COMPLETE
Calc EF: 62 %
S' Lateral: 3 cm
Single Plane A2C EF: 65.4 %
Single Plane A4C EF: 61 %

## 2024-04-27 LAB — BRAIN NATRIURETIC PEPTIDE: B Natriuretic Peptide: 10.1 pg/mL (ref 0.0–100.0)

## 2024-04-27 MED ORDER — SPIRONOLACTONE 25 MG PO TABS: 25.0000 mg | ORAL_TABLET | Freq: Every day | ORAL | 4 refills | Status: AC

## 2024-04-27 MED ORDER — EMPAGLIFLOZIN 10 MG PO TABS
10.0000 mg | ORAL_TABLET | Freq: Every day | ORAL | 3 refills | Status: AC
Start: 2024-04-27 — End: ?

## 2024-04-27 MED ORDER — ENTRESTO 24-26 MG PO TABS: 1.0000 | ORAL_TABLET | Freq: Two times a day (BID) | ORAL | 3 refills | Status: AC

## 2024-04-27 MED ORDER — ROSUVASTATIN CALCIUM 10 MG PO TABS: 10.0000 mg | ORAL_TABLET | Freq: Every day | ORAL | 4 refills | Status: DC

## 2024-04-27 MED ORDER — ENTRESTO 24-26 MG PO TABS: 1.0000 | ORAL_TABLET | Freq: Two times a day (BID) | ORAL | 3 refills | Status: DC

## 2024-04-27 MED ORDER — CARVEDILOL 3.125 MG PO TABS: 3.1250 mg | ORAL_TABLET | Freq: Two times a day (BID) | ORAL | 4 refills | Status: AC

## 2024-04-27 NOTE — Progress Notes (Signed)
 PCP: Spero Dye, PA-C HF Cardiologist: Dr Julane Ny  HPI:  Mr Holland is a 68 y.o. with history of anxiety, depression, and prostate cancer. Diagnosed with systolic HF 4/22.   Presented with acute HF in 4/22.  LHC showed only minimal CAD. Filling pressures and CO ok on RHC. PVCs noted on tele, but burden not high enough to cause CM. cMRI was performed prior to d/c EF 16%. GDMT initiated. D/C weight 215 pounds.   Echo 07/05/21 EF 25-30%  Echo 03/18/22 EF 50-55%  Echo 5/24 EF 45-50%   Today he returns for HF follow up. Overall feeling fine. PSA elevated and now on triporelin injections. Feels fatigue with these.  No SOB with activity or with working out. Denies palpitations, abnormal bleeding, CP, dizziness, edema, or PND/Orthopnea. Appetite ok.  Weight at home 210 pounds. Taking all medications. Home BP 99-116/60-70s. Sees Dr. Kyung Pi in [facility].   Echo today 04/27/24 EF 50-55%.  Cardiac Studies - Echo 5/24: EF 45-50% - Echo 5/23: EF 50-55% - Echo 8/22: EF 25-30% - Echo 4/22: EF 20-25%   - R/LHC 4/22: distal LAD 40% RA 2, PA 27/15 (21), PCWP 13, CO/CI (Fick) 6/2.6, PVR 1.2  - CMRI 02/25/21 1. Severely dilated left ventricle, EF 16% with severe global hypokinesis.  2. Normal right ventricular size with moderately decreased systolic function, EF 30%.  3. Nonspecific inferior RV insertion site LGE. This suggests pressure/volume overload.  4. Mildly elevated ECV percentage in septum, not in amyloidosis range.  ROS: All systems negative except as listed in HPI, PMH and Problem List.  SH:  Social History   Socioeconomic History   Marital status: Married    Spouse name: Not on file   Number of children: Not on file   Years of education: Not on file   Highest education level: Not on file  Occupational History    Employer: [employer]    Comment: Full Time   Tobacco Use   Smoking status: Never   Smokeless tobacco: Never  Substance and Sexual Activity    Alcohol use: Not Currently   Drug use: Never   Sexual activity: Not on file  Other Topics Concern   Not on file  Social History Narrative   Not on file   Social Drivers of Health   Financial Resource Strain: Not on file  Food Insecurity: Low Risk  (12/10/2023)   Received from [facility]   Hunger Vital Sign    Worried About Running Out of Food in the Last Year: Never true    Ran Out of Food in the Last Year: Never true  Transportation Needs: No Transportation Needs (12/10/2023)   Received from [facility]    In the past 12 months, has lack of reliable transportation kept you from medical appointments, meetings, work or from getting things needed for daily living? : No  Physical Activity: Not on file  Stress: Not on file  Social Connections: Not on file  Intimate Partner Violence: Not on file   FH:  Family History  Problem Relation Age of Onset   Hypertension Father    Past Medical History:  Diagnosis Date   Anxiety    Hypertension    Rhinitis    Current Outpatient Medications  Medication Sig Dispense Refill   acetaminophen (TYLENOL) 500 MG tablet Take 500 mg by mouth every 6 (six) hours as needed.     ALPRAZolam (XANAX) 0.5 MG tablet Take 0.25-0.5 mg by mouth at bedtime as needed  for anxiety or sleep.     aspirin -acetaminophen-caffeine (EXCEDRIN MIGRAINE) 250-250-65 MG tablet Take 2 tablets by mouth every 6 (six) hours as needed for headache.     bicalutamide (CASODEX) 50 MG tablet Take 50 mg by mouth daily.     busPIRone (BUSPAR) 7.5 MG tablet Take 30 mg by mouth at bedtime.     carvedilol  (COREG ) 3.125 MG tablet TAKE 1 TABLET BY MOUTH TWICE  DAILY 180 tablet 3   clonazePAM  (KLONOPIN ) 1 MG tablet Take 1 mg by mouth at bedtime.     DULoxetine (CYMBALTA) 30 MG capsule Take 90 mg by mouth at bedtime.     famotidine (PEPCID) 20 MG tablet Take 20 mg by mouth daily as needed.     fluticasone  (FLONASE ) 50 MCG/ACT nasal spray Place 2 sprays into both  nostrils at bedtime.     Guaifenesin (MUCINEX MAXIMUM STRENGTH) 1200 MG TB12 Take 1,200 mg by mouth at bedtime.     JARDIANCE  10 MG TABS tablet TAKE 1 TABLET BY MOUTH DAILY  BEFORE BREAKFAST 90 tablet 3   montelukast (SINGULAIR) 10 MG tablet Take 10 mg by mouth at bedtime.     Omega-3 Fatty Acids (FISH OIL) 1000 MG CAPS Take 1,000 mg by mouth.     polyethylene glycol (MIRALAX / GLYCOLAX) 17 g packet Take 17 g by mouth daily as needed.     Probiotic Product (PROBIOTIC-10 ULTIMATE PO) Take by mouth. Every other day     Psyllium (METAMUCIL PO) Patient takes 1 teaspoon by mouth daily in a glass of water.     rosuvastatin  (CRESTOR ) 10 MG tablet TAKE 1 TABLET BY MOUTH DAILY 90 tablet 0   sacubitril -valsartan  (ENTRESTO ) 24-26 MG Take 1 tablet by mouth 2 (two) times daily. (Patient taking differently: Take 0.5 tablets by mouth 2 (two) times daily.) 180 tablet 3   Simethicone (GAS-X PO) Take 1 tablet by mouth as needed.     spironolactone  (ALDACTONE ) 25 MG tablet TAKE 1 TABLET BY MOUTH DAILY 90 tablet 3   tadalafil (CIALIS) 5 MG tablet Take 5 mg by mouth as needed for erectile dysfunction.     triptorelin (TRELESTAR LA) 11.25 MG injection Every 3 months     No current facility-administered medications for this encounter.   BP 122/68   Pulse (!) 102   Ht 6' 2 (1.88 m)   Wt 97.9 kg (215 lb 12.8 oz)   SpO2 96%   BMI 27.71 kg/m   Wt Readings from Last 3 Encounters:  04/27/24 97.9 kg (215 lb 12.8 oz)  04/16/23 93.4 kg (206 lb)  03/18/22 87.6 kg (193 lb 3.2 oz)   PHYSICAL EXAM: General:  NAD. No resp difficulty HEENT: Normal Neck: Supple. No JVD. Cor: Regular rate & rhythm. No rubs, gallops or murmurs. Lungs: Clear Abdomen: Soft, nontender, nondistended.  Extremities: No cyanosis, clubbing, rash, edema Neuro: Alert & oriented x 3, moves all 4 extremities w/o difficulty. Affect pleasant.  ECG (personally reviewed): NSR 93 bpm  ASSESSMENT & PLAN:  1. Chronic systolic HF:  - Nonischemic  cardiomyopathy, uncertain etiology.   - Echo 4/22: EF 20-25% RV mildly reduced. LHC minimal CAD.  Filling pressures and CO ok on RHC.  - cMRI 4/22: EF 16% RV 30% LGE at RV insertion site  - Echo 07/05/21: EF ~30%, not referred for ICD with stable NYHA I symptoms - Echo 03/18/22: EF 50-55% - Echo 5/24: EF 45-50%  - Echo today 04/27/24: EF 50-55% - Doing great NYHA I. Volume looks  good. - Continue spironolactone  25 mg daily.  - Continue Entresto  12/13 mg bid. - Continue Jardiance  10 mg daily.  - Continue Coreg  3.125 mg bid. - Repeat echo in 1 year  - Labs today.   2. PVCs - Not felt to be high burden to explain cardiomyopathy.  - None on ECG today. - Continue current dose of Coreg . - Sleep study 6/22 AHI 4.4   3. HTN  - BP OK on current meds - Continue meds as above.   4. HL - Continue rosuvastatin  10 mg daily. - followed PCP  Follow up in 1 year with Dr. Julane Ny + echo  Elmarie Hacking FNP- [facility] 2:24 PM

## 2024-04-27 NOTE — Patient Instructions (Addendum)
 Good to see you today!  Your physician has requested that you have an echocardiogram. Echocardiography is a painless test that uses sound waves to create images of your heart. It provides your doctor with information about the size and shape of your heart and how well your heart's chambers and valves are working. This procedure takes approximately one hour. There are no restrictions for this procedure. Please do NOT wear cologne, perfume, aftershave, or lotions (deodorant is allowed). Please arrive 15 minutes prior to your appointment time.  Please note: We ask at that you not bring children with you during ultrasound (echo/ vascular) testing. Due to room size and safety concerns, children are not allowed in the ultrasound rooms during exams. Our front office staff cannot provide observation of children in our lobby area while testing is being conducted. An adult accompanying a patient to their appointment will only be allowed in the ultrasound room at the discretion of the ultrasound technician under special circumstances. We apologize for any inconvenience.  Your physician recommends that you schedule a follow-up appointment 1 year with echocardiogram(June 2026) Call office in April  to schedule an appointment  If you have any questions or concerns before your next appointment please send us  a message through [place] or call our office at [phone].    TO LEAVE A MESSAGE FOR THE NURSE SELECT OPTION 2, PLEASE LEAVE A MESSAGE INCLUDING: YOUR NAME DATE OF BIRTH CALL BACK NUMBER REASON FOR CALL**this is important as we prioritize the call backs  YOU WILL RECEIVE A CALL BACK THE SAME DAY AS LONG AS YOU CALL BEFORE 4:00 PM At the [facility], you and your health needs are our priority. As part of our continuing mission to provide you with exceptional heart care, we have created designated Provider Care Teams. These Care Teams include your primary Cardiologist (physician) and  Advanced Practice Providers (APPs- Physician Assistants and Nurse Practitioners) who all work together to provide you with the care you need, when you need it.   You may see any of the following providers on your designated Care Team at your next follow up: Dr Jules Oar Dr Peder Bourdon Dr. Alwin Baars Dr. Arta Lark Amy Marijane Shoulders, NP Ruddy Corral, [place] [facility] [place], [place] Dennise Fitz, NP [place] Lee, NP Shawnee Dellen, NP Luster Salters, PharmD Bevely Brush, PharmD   Please be sure to bring in all your medications bottles to every appointment.    Thank you for choosing [facility]

## 2024-06-15 ENCOUNTER — Other Ambulatory Visit (HOSPITAL_COMMUNITY): Payer: Self-pay

## 2024-06-15 DIAGNOSIS — I5022 Chronic systolic (congestive) heart failure: Secondary | ICD-10-CM

## 2024-09-15 ENCOUNTER — Encounter: Payer: Self-pay | Admitting: Radiation Oncology

## 2024-09-19 ENCOUNTER — Encounter: Payer: Self-pay | Admitting: Radiation Oncology

## 2024-09-19 ENCOUNTER — Ambulatory Visit
Admission: RE | Admit: 2024-09-19 | Discharge: 2024-09-19 | Disposition: A | Discharge status: Home / Self Care | Source: Ambulatory Visit | Attending: Radiation Oncology | Admitting: Radiation Oncology

## 2024-09-19 VITALS — BP 123/86 | HR 112 | Temp 98.0°F | Resp 18 | Ht 75.2 in | Wt 216.0 lb

## 2024-09-19 DIAGNOSIS — Z8042 Family history of malignant neoplasm of prostate: Secondary | ICD-10-CM | POA: Insufficient documentation

## 2024-09-19 DIAGNOSIS — C61 Malignant neoplasm of prostate: Secondary | ICD-10-CM | POA: Diagnosis present

## 2024-09-19 DIAGNOSIS — Z7984 Long term (current) use of oral hypoglycemic drugs: Secondary | ICD-10-CM | POA: Insufficient documentation

## 2024-09-19 DIAGNOSIS — Z79899 Other long term (current) drug therapy: Secondary | ICD-10-CM | POA: Insufficient documentation

## 2024-09-19 DIAGNOSIS — E785 Hyperlipidemia, unspecified: Secondary | ICD-10-CM | POA: Insufficient documentation

## 2024-09-19 DIAGNOSIS — K219 Gastro-esophageal reflux disease without esophagitis: Secondary | ICD-10-CM | POA: Insufficient documentation

## 2024-09-19 DIAGNOSIS — Z803 Family history of malignant neoplasm of breast: Secondary | ICD-10-CM | POA: Diagnosis not present

## 2024-09-19 DIAGNOSIS — I509 Heart failure, unspecified: Secondary | ICD-10-CM | POA: Diagnosis not present

## 2024-09-19 DIAGNOSIS — Z9079 Acquired absence of other genital organ(s): Secondary | ICD-10-CM | POA: Insufficient documentation

## 2024-09-19 NOTE — Progress Notes (Signed)
 Radiation Oncology         [phone] ________________________________  Name: Neil Weber        MRN: [id number]  Date of Service: 09/19/2024 DOB: [birth year]  RR:Wnijo, Neil Browner, PA-C       REFERRING PHYSICIAN: Marda General, MD   DIAGNOSIS: Rising PSA, post prostatectomy   HISTORY OF PRESENT ILLNESS: Neil Weber is a 68 y.o. male seen at the request of Dr. Marda.  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 Tommi at [facility]  Pathology revealed Gleason 3+4 = 7 adenocarcinoma.  Surgical margins were clear.  Perineural invasion was seen.  No lymph nodes were involved with carcinoma.  He was therefore staged as a pT2cpN0.  His PSA reached a low of less than 0.1 in early 2020, but gradually began rising, reaching 0.3 in August of last year.  He underwent PET CT imaging in September of last year; no evidence of uptake in the prostate bed, or elsewhere, was seen.  He has completed 6 months of Trelstar, and is currently on bicalutamide.  His most recent PSA, from October 20, came back at less than 0.1.  Consultation is requested regarding the potential role of radiation in Neil Weber' care.    PREVIOUS RADIATION THERAPY: No   PAST MEDICAL HISTORY:  Past Medical History:  Diagnosis Date   Acid reflux    Anxiety    Congestive heart failure (CHF) (HCC)    Depression    ED (erectile dysfunction)    Prostate cancer (HCC)    Rhinitis        PAST SURGICAL HISTORY: Past Surgical History:  Procedure Laterality Date   PROSTATECTOMY  2016   RIGHT/LEFT HEART CATH AND CORONARY ANGIOGRAPHY N/A 02/22/2021   Procedure: RIGHT/LEFT HEART CATH AND CORONARY ANGIOGRAPHY;  Surgeon: Cherrie Toribio SAUNDERS, MD;  Location: [facility];  Service: Cardiovascular;  Laterality: N/A;     FAMILY HISTORY:  Family History  Problem Relation Age of Onset   Breast cancer Mother    Hyperlipidemia  Father    Renal cancer Father    Cancer - Prostate Father    Breast cancer Maternal Aunt    Cancer - Prostate Maternal Grandfather    Cancer Maternal Grandfather      SOCIAL HISTORY:  reports that he has never smoked. He has never been exposed to tobacco smoke. He has never used smokeless tobacco. He reports that he does not currently use alcohol. He reports that he does not use drugs.   ALLERGIES: Patient has no active allergies.   MEDICATIONS:  Current Outpatient Medications  Medication Sig Dispense Refill   acetaminophen (TYLENOL) 500 MG tablet Take 500 mg by mouth every 6 (six) hours as needed.     ALPRAZolam (XANAX) 0.5 MG tablet Take 0.25-0.5 mg by mouth at bedtime as needed for anxiety or sleep.     aspirin -acetaminophen-caffeine (EXCEDRIN MIGRAINE) 250-250-65 MG tablet Take 2 tablets by mouth every 6 (six) hours as needed for headache.     bicalutamide (CASODEX) 50 MG tablet Take 50 mg by mouth daily.     busPIRone (BUSPAR) 7.5 MG tablet Take 30 mg by mouth at bedtime.     carvedilol  (COREG ) 3.125 MG tablet Take 1 tablet (3.125 mg total) by mouth 2 (two) times daily. 180 tablet 4   clonazePAM  (KLONOPIN ) 1 MG tablet Take 1 mg by mouth at bedtime.     DULoxetine (  CYMBALTA) 30 MG capsule Take 90 mg by mouth at bedtime.     empagliflozin  (JARDIANCE ) 10 MG TABS tablet Take 1 tablet (10 mg total) by mouth daily before breakfast. 90 tablet 3   famotidine (PEPCID) 20 MG tablet Take 20 mg by mouth daily as needed.     fluticasone  (FLONASE ) 50 MCG/ACT nasal spray Place 2 sprays into both nostrils at bedtime.     Guaifenesin (MUCINEX MAXIMUM STRENGTH) 1200 MG TB12 Take 1,200 mg by mouth at bedtime.     montelukast (SINGULAIR) 10 MG tablet Take 10 mg by mouth at bedtime.     Omega-3 Fatty Acids (FISH OIL) 1000 MG CAPS Take 1,000 mg by mouth.     polyethylene glycol (MIRALAX / GLYCOLAX) 17 g packet Take 17 g by mouth daily as needed.     Probiotic Product (PROBIOTIC-10 ULTIMATE PO) Take  by mouth. Every other day     Psyllium (METAMUCIL PO) Patient takes 1 teaspoon by mouth daily in a glass of water.     sacubitril -valsartan  (ENTRESTO ) 24-26 MG Take 1 tablet by mouth 2 (two) times daily. Take 1/2 tablet .bid 180 tablet 3   Simethicone (GAS-X PO) Take 1 tablet by mouth as needed.     spironolactone  (ALDACTONE ) 25 MG tablet Take 1 tablet (25 mg total) by mouth daily. 90 tablet 4   tadalafil (CIALIS) 5 MG tablet Take 5 mg by mouth as needed for erectile dysfunction.     triptorelin (TRELESTAR LA) 11.25 MG injection Every 3 months     No current facility-administered medications for this encounter.     REVIEW OF SYSTEMS: On review of systems, the patient reports that he is doing well overall. He denies any chest pain, shortness of breath, cough, fevers, chills, night sweats, unintended weight changes. He denies any bowel or bladder disturbances, and denies abdominal pain, nausea or vomiting. He denies any new musculoskeletal or joint aches or pains.  He has not been able to achieve erections since his prostatectomy.  A complete review of systems is obtained and is otherwise negative.     PHYSICAL EXAM:  Wt Readings from Last 3 Encounters:  09/19/24 216 lb (98 kg)  04/27/24 215 lb 12.8 oz (97.9 kg)  04/16/23 206 lb (93.4 kg)   Temp Readings from Last 3 Encounters:  09/19/24 98 F (36.7 C) (Oral)  02/25/21 98.1 F (36.7 C) (Oral)   BP Readings from Last 3 Encounters:  09/19/24 123/86  04/27/24 122/68  04/16/23 110/70   Pulse Readings from Last 3 Encounters:  09/19/24 (!) 112  04/27/24 (!) 102  04/16/23 73   Pain Assessment Pain Score: 0-No pain/10  In general this is a well appearing male in no acute distress. He's alert and oriented x4 and appropriate throughout the examination. Cardiopulmonary assessment is negative for acute distress and he exhibits normal effort.  His abdomen is nontender and nondistended.    ECOG = 0  0 - Asymptomatic (Fully active, able  to carry on all predisease activities without restriction)  1 - Symptomatic but completely ambulatory (Restricted in physically strenuous activity but ambulatory and able to carry out work of a light or sedentary nature. For example, light housework, office work)  2 - Symptomatic, <50% in bed during the day (Ambulatory and capable of all self care but unable to carry out any work activities. Up and about more than 50% of waking hours)  3 - Symptomatic, >50% in bed, but not bedbound (Capable of only limited self-care,  confined to bed or chair 50% or more of waking hours)  4 - Bedbound (Completely disabled. Cannot carry on any self-care. Totally confined to bed or chair)  5 - Death   Raylene MM, Creech RH, Tormey DC, et al. [phone]). Toxicity and response criteria of the [facility]. Am. DOROTHA Bridges. Oncol. 5 (6): 649-55     RADIOGRAPHY: Please note that I have personally reviewed the patient's PET CT scan from September of last year, with the results documented above.     IMPRESSION/PLAN: 1.  The patient is a 68 year old male status post radical prostatectomy in 2016 for a Gleason 3+4 =l 7 adenocarcinoma of the prostate.  His PSA began rising and early 2022.  PSMA PET/CT imaging revealed no evidence of radiotracer uptake.  He has completed treatment with Trelstar under the care of Dr. Marda.  I spoke at length with the patient and his wife regarding the role of external beam radiation in the setting of rising PSA, post prostatectomy.  Given the patient's Gleason score, rate of PSA rise, and interval since prostatectomy, I feel that there is a good chance his rise in the PSA is due to local recurrence within the prostate bed.  I have recommended to him that we proceed with external beam radiation to his prostate bed; I reviewed the rationale behind this recommendation, as well as the potential side effects of the external beam radiation in his clinical scenario.  I explained that  these may include, but are not limited to, irritation of the bladder, irritation of the rectum, and fatigue.  I also discussed potential long-term side effects of radiation; I explained that these may include, but are not limited to, chronic injury to bowel, bladder, sexual function, and other normal tissues.  He expressed understanding, and is agreeable to proceed.  Arrangements will be made for him to return to our department for simulation and treatment planning.   In a visit lasting 60 minutes, greater than 50% of the time was spent face to face discussing the patient's condition, in preparation for the discussion, and coordinating the patient's care.    Merita Hawks A. Jomarie, MD   **Disclaimer: This note was dictated with voice recognition software. Similar sounding words can inadvertently be transcribed and this note may contain transcription errors which may not have been corrected upon publication of note.**

## 2024-09-22 ENCOUNTER — Telehealth: Payer: Self-pay

## 2024-09-22 NOTE — Telephone Encounter (Signed)
 [facility] Clinical Social Work  Clinical Social Work was referred by engineer, civil (consulting) for advance directives questions.  Clinical Social Worker attempted to contact patient by phone to offer support and assess for needs.     Interventions: CSW left detailed voicemail provided information on how to complete Advance Directives at [facility] and in the [place]. CSW provided direct contact for patient to return call if needed.      Follow Up Plan:  Referral will be closed.    Lizbeth Sprague, LCSW  Clinical Social Worker [facility]

## 2024-09-29 ENCOUNTER — Ambulatory Visit
Admission: RE | Admit: 2024-09-29 | Discharge: 2024-09-29 | Disposition: A | Discharge status: Home / Self Care | Source: Ambulatory Visit | Attending: Radiation Oncology | Admitting: Radiation Oncology

## 2024-09-29 DIAGNOSIS — C61 Malignant neoplasm of prostate: Secondary | ICD-10-CM | POA: Diagnosis not present

## 2024-09-29 DIAGNOSIS — Z510 Encounter for antineoplastic radiation therapy: Secondary | ICD-10-CM | POA: Diagnosis present

## 2024-10-04 DIAGNOSIS — Z510 Encounter for antineoplastic radiation therapy: Secondary | ICD-10-CM | POA: Diagnosis not present

## 2024-10-10 ENCOUNTER — Other Ambulatory Visit: Payer: Self-pay

## 2024-10-10 ENCOUNTER — Ambulatory Visit
Admission: RE | Admit: 2024-10-10 | Discharge: 2024-10-10 | Disposition: A | Discharge status: Home / Self Care | Source: Ambulatory Visit | Attending: Radiation Oncology | Admitting: Radiation Oncology

## 2024-10-10 DIAGNOSIS — Z510 Encounter for antineoplastic radiation therapy: Secondary | ICD-10-CM | POA: Diagnosis not present

## 2024-10-10 LAB — RAD ONC ARIA SESSION SUMMARY
Course Elapsed Days: 0
Plan Fractions Treated to Date: 1
Plan Prescribed Dose Per Fraction: 2 Gy
Plan Total Fractions Prescribed: 33
Plan Total Prescribed Dose: 66 Gy
Reference Point Dosage Given to Date: 2 Gy
Reference Point Session Dosage Given: 2 Gy
Session Number: 1

## 2024-10-11 ENCOUNTER — Ambulatory Visit
Admission: RE | Admit: 2024-10-11 | Discharge: 2024-10-11 | Disposition: A | Source: Ambulatory Visit | Attending: Radiation Oncology

## 2024-10-11 ENCOUNTER — Ambulatory Visit
Admission: RE | Admit: 2024-10-11 | Discharge: 2024-10-11 | Attending: Radiation Oncology | Admitting: Radiation Oncology

## 2024-10-11 ENCOUNTER — Other Ambulatory Visit: Payer: Self-pay

## 2024-10-11 DIAGNOSIS — Z510 Encounter for antineoplastic radiation therapy: Secondary | ICD-10-CM | POA: Diagnosis not present

## 2024-10-11 LAB — RAD ONC ARIA SESSION SUMMARY
Course Elapsed Days: 1
Plan Fractions Treated to Date: 2
Plan Prescribed Dose Per Fraction: 2 Gy
Plan Total Fractions Prescribed: 33
Plan Total Prescribed Dose: 66 Gy
Reference Point Dosage Given to Date: 4 Gy
Reference Point Session Dosage Given: 2 Gy
Session Number: 2

## 2024-10-12 ENCOUNTER — Ambulatory Visit
Admission: RE | Admit: 2024-10-12 | Discharge: 2024-10-12 | Disposition: A | Discharge status: Home / Self Care | Source: Ambulatory Visit | Attending: Radiation Oncology | Admitting: Radiation Oncology

## 2024-10-12 ENCOUNTER — Other Ambulatory Visit: Payer: Self-pay

## 2024-10-12 DIAGNOSIS — Z510 Encounter for antineoplastic radiation therapy: Secondary | ICD-10-CM | POA: Diagnosis not present

## 2024-10-12 LAB — RAD ONC ARIA SESSION SUMMARY
Course Elapsed Days: 2
Plan Fractions Treated to Date: 3
Plan Prescribed Dose Per Fraction: 2 Gy
Plan Total Fractions Prescribed: 33
Plan Total Prescribed Dose: 66 Gy
Reference Point Dosage Given to Date: 6 Gy
Reference Point Session Dosage Given: 2 Gy
Session Number: 3

## 2024-10-17 ENCOUNTER — Ambulatory Visit
Admission: RE | Admit: 2024-10-17 | Discharge: 2024-10-17 | Disposition: A | Source: Ambulatory Visit | Attending: Radiation Oncology

## 2024-10-17 ENCOUNTER — Other Ambulatory Visit: Payer: Self-pay

## 2024-10-17 DIAGNOSIS — Z510 Encounter for antineoplastic radiation therapy: Secondary | ICD-10-CM | POA: Diagnosis present

## 2024-10-17 DIAGNOSIS — C61 Malignant neoplasm of prostate: Secondary | ICD-10-CM | POA: Diagnosis not present

## 2024-10-17 LAB — RAD ONC ARIA SESSION SUMMARY
Course Elapsed Days: 7
Plan Fractions Treated to Date: 4
Plan Prescribed Dose Per Fraction: 2 Gy
Plan Total Fractions Prescribed: 33
Plan Total Prescribed Dose: 66 Gy
Reference Point Dosage Given to Date: 8 Gy
Reference Point Session Dosage Given: 2 Gy
Session Number: 4

## 2024-10-18 ENCOUNTER — Ambulatory Visit
Admission: RE | Admit: 2024-10-18 | Discharge: 2024-10-18 | Disposition: A | Source: Ambulatory Visit | Attending: Radiation Oncology | Admitting: Radiation Oncology

## 2024-10-18 ENCOUNTER — Ambulatory Visit
Admission: RE | Admit: 2024-10-18 | Discharge: 2024-10-18 | Disposition: A | Source: Ambulatory Visit | Attending: Radiation Oncology

## 2024-10-18 ENCOUNTER — Other Ambulatory Visit: Payer: Self-pay

## 2024-10-18 DIAGNOSIS — Z510 Encounter for antineoplastic radiation therapy: Secondary | ICD-10-CM | POA: Diagnosis not present

## 2024-10-18 LAB — RAD ONC ARIA SESSION SUMMARY
Course Elapsed Days: 8
Plan Fractions Treated to Date: 5
Plan Prescribed Dose Per Fraction: 2 Gy
Plan Total Fractions Prescribed: 33
Plan Total Prescribed Dose: 66 Gy
Reference Point Dosage Given to Date: 10 Gy
Reference Point Session Dosage Given: 2 Gy
Session Number: 5

## 2024-10-19 ENCOUNTER — Ambulatory Visit
Admission: RE | Admit: 2024-10-19 | Discharge: 2024-10-19 | Disposition: A | Source: Ambulatory Visit | Attending: Radiation Oncology

## 2024-10-19 ENCOUNTER — Other Ambulatory Visit: Payer: Self-pay

## 2024-10-19 DIAGNOSIS — Z510 Encounter for antineoplastic radiation therapy: Secondary | ICD-10-CM | POA: Diagnosis not present

## 2024-10-19 LAB — RAD ONC ARIA SESSION SUMMARY
Course Elapsed Days: 9
Plan Fractions Treated to Date: 6
Plan Prescribed Dose Per Fraction: 2 Gy
Plan Total Fractions Prescribed: 33
Plan Total Prescribed Dose: 66 Gy
Reference Point Dosage Given to Date: 12 Gy
Reference Point Session Dosage Given: 2 Gy
Session Number: 6

## 2024-10-20 ENCOUNTER — Other Ambulatory Visit: Payer: Self-pay

## 2024-10-20 ENCOUNTER — Ambulatory Visit
Admission: RE | Admit: 2024-10-20 | Discharge: 2024-10-20 | Disposition: A | Source: Ambulatory Visit | Attending: Radiation Oncology

## 2024-10-20 DIAGNOSIS — Z510 Encounter for antineoplastic radiation therapy: Secondary | ICD-10-CM | POA: Diagnosis not present

## 2024-10-20 LAB — RAD ONC ARIA SESSION SUMMARY
Course Elapsed Days: 10
Plan Fractions Treated to Date: 7
Plan Prescribed Dose Per Fraction: 2 Gy
Plan Total Fractions Prescribed: 33
Plan Total Prescribed Dose: 66 Gy
Reference Point Dosage Given to Date: 14 Gy
Reference Point Session Dosage Given: 2 Gy
Session Number: 7

## 2024-10-21 ENCOUNTER — Other Ambulatory Visit: Payer: Self-pay

## 2024-10-21 ENCOUNTER — Ambulatory Visit
Admission: RE | Admit: 2024-10-21 | Discharge: 2024-10-21 | Disposition: A | Source: Ambulatory Visit | Attending: Radiation Oncology

## 2024-10-21 DIAGNOSIS — Z510 Encounter for antineoplastic radiation therapy: Secondary | ICD-10-CM | POA: Diagnosis not present

## 2024-10-21 LAB — RAD ONC ARIA SESSION SUMMARY
Course Elapsed Days: 11
Plan Fractions Treated to Date: 8
Plan Prescribed Dose Per Fraction: 2 Gy
Plan Total Fractions Prescribed: 33
Plan Total Prescribed Dose: 66 Gy
Reference Point Dosage Given to Date: 16 Gy
Reference Point Session Dosage Given: 2 Gy
Session Number: 8

## 2024-10-24 ENCOUNTER — Ambulatory Visit
Admission: RE | Admit: 2024-10-24 | Discharge: 2024-10-24 | Disposition: A | Source: Ambulatory Visit | Attending: Radiation Oncology

## 2024-10-24 ENCOUNTER — Other Ambulatory Visit: Payer: Self-pay

## 2024-10-24 DIAGNOSIS — Z510 Encounter for antineoplastic radiation therapy: Secondary | ICD-10-CM | POA: Diagnosis not present

## 2024-10-24 LAB — RAD ONC ARIA SESSION SUMMARY
Course Elapsed Days: 14
Plan Fractions Treated to Date: 9
Plan Prescribed Dose Per Fraction: 2 Gy
Plan Total Fractions Prescribed: 33
Plan Total Prescribed Dose: 66 Gy
Reference Point Dosage Given to Date: 18 Gy
Reference Point Session Dosage Given: 2 Gy
Session Number: 9

## 2024-10-25 ENCOUNTER — Ambulatory Visit
Admission: RE | Admit: 2024-10-25 | Discharge: 2024-10-25 | Disposition: A | Source: Ambulatory Visit | Attending: Radiation Oncology | Admitting: Radiation Oncology

## 2024-10-25 ENCOUNTER — Other Ambulatory Visit: Payer: Self-pay

## 2024-10-25 ENCOUNTER — Ambulatory Visit
Admission: RE | Admit: 2024-10-25 | Discharge: 2024-10-25 | Attending: Radiation Oncology | Admitting: Radiation Oncology

## 2024-10-25 ENCOUNTER — Ambulatory Visit
Admission: RE | Admit: 2024-10-25 | Discharge: 2024-10-25 | Disposition: A | Source: Ambulatory Visit | Attending: Radiation Oncology

## 2024-10-25 DIAGNOSIS — Z510 Encounter for antineoplastic radiation therapy: Secondary | ICD-10-CM | POA: Diagnosis not present

## 2024-10-25 LAB — RAD ONC ARIA SESSION SUMMARY
Course Elapsed Days: 15
Plan Fractions Treated to Date: 10
Plan Prescribed Dose Per Fraction: 2 Gy
Plan Total Fractions Prescribed: 33
Plan Total Prescribed Dose: 66 Gy
Reference Point Dosage Given to Date: 20 Gy
Reference Point Session Dosage Given: 2 Gy
Session Number: 10

## 2024-10-26 ENCOUNTER — Ambulatory Visit
Admission: RE | Admit: 2024-10-26 | Discharge: 2024-10-26 | Disposition: A | Discharge status: Home / Self Care | Source: Ambulatory Visit | Attending: Radiation Oncology | Admitting: Radiation Oncology

## 2024-10-26 ENCOUNTER — Other Ambulatory Visit: Payer: Self-pay

## 2024-10-26 DIAGNOSIS — Z510 Encounter for antineoplastic radiation therapy: Secondary | ICD-10-CM | POA: Diagnosis not present

## 2024-10-26 LAB — RAD ONC ARIA SESSION SUMMARY
Course Elapsed Days: 16
Plan Fractions Treated to Date: 11
Plan Prescribed Dose Per Fraction: 2 Gy
Plan Total Fractions Prescribed: 33
Plan Total Prescribed Dose: 66 Gy
Reference Point Dosage Given to Date: 22 Gy
Reference Point Session Dosage Given: 2 Gy
Session Number: 11

## 2024-10-27 ENCOUNTER — Ambulatory Visit
Admission: RE | Admit: 2024-10-27 | Discharge: 2024-10-27 | Disposition: A | Discharge status: Home / Self Care | Source: Ambulatory Visit | Attending: Radiation Oncology | Admitting: Radiation Oncology

## 2024-10-27 ENCOUNTER — Other Ambulatory Visit: Payer: Self-pay

## 2024-10-27 DIAGNOSIS — Z510 Encounter for antineoplastic radiation therapy: Secondary | ICD-10-CM | POA: Diagnosis not present

## 2024-10-27 LAB — RAD ONC ARIA SESSION SUMMARY
Course Elapsed Days: 17
Plan Fractions Treated to Date: 12
Plan Prescribed Dose Per Fraction: 2 Gy
Plan Total Fractions Prescribed: 33
Plan Total Prescribed Dose: 66 Gy
Reference Point Dosage Given to Date: 24 Gy
Reference Point Session Dosage Given: 2 Gy
Session Number: 12

## 2024-10-28 ENCOUNTER — Other Ambulatory Visit: Payer: Self-pay

## 2024-10-28 ENCOUNTER — Ambulatory Visit
Admission: RE | Admit: 2024-10-28 | Discharge: 2024-10-28 | Disposition: A | Source: Ambulatory Visit | Attending: Radiation Oncology

## 2024-10-28 DIAGNOSIS — Z510 Encounter for antineoplastic radiation therapy: Secondary | ICD-10-CM | POA: Diagnosis not present

## 2024-10-28 LAB — RAD ONC ARIA SESSION SUMMARY
Course Elapsed Days: 18
Plan Fractions Treated to Date: 13
Plan Prescribed Dose Per Fraction: 2 Gy
Plan Total Fractions Prescribed: 33
Plan Total Prescribed Dose: 66 Gy
Reference Point Dosage Given to Date: 26 Gy
Reference Point Session Dosage Given: 2 Gy
Session Number: 13

## 2024-10-31 ENCOUNTER — Ambulatory Visit
Admission: RE | Admit: 2024-10-31 | Discharge: 2024-10-31 | Disposition: A | Source: Ambulatory Visit | Attending: Radiation Oncology

## 2024-10-31 ENCOUNTER — Other Ambulatory Visit: Payer: Self-pay

## 2024-10-31 DIAGNOSIS — Z510 Encounter for antineoplastic radiation therapy: Secondary | ICD-10-CM | POA: Diagnosis not present

## 2024-10-31 LAB — RAD ONC ARIA SESSION SUMMARY
Course Elapsed Days: 21
Plan Fractions Treated to Date: 14
Plan Prescribed Dose Per Fraction: 2 Gy
Plan Total Fractions Prescribed: 33
Plan Total Prescribed Dose: 66 Gy
Reference Point Dosage Given to Date: 28 Gy
Reference Point Session Dosage Given: 2 Gy
Session Number: 14

## 2024-11-01 ENCOUNTER — Ambulatory Visit: Admitting: Radiation Oncology

## 2024-11-01 ENCOUNTER — Other Ambulatory Visit: Payer: Self-pay

## 2024-11-01 ENCOUNTER — Ambulatory Visit
Admission: RE | Admit: 2024-11-01 | Discharge: 2024-11-01 | Attending: Radiation Oncology | Admitting: Radiation Oncology

## 2024-11-01 ENCOUNTER — Ambulatory Visit
Admission: RE | Admit: 2024-11-01 | Discharge: 2024-11-01 | Disposition: A | Source: Ambulatory Visit | Attending: Radiation Oncology

## 2024-11-01 DIAGNOSIS — Z510 Encounter for antineoplastic radiation therapy: Secondary | ICD-10-CM | POA: Diagnosis not present

## 2024-11-01 LAB — RAD ONC ARIA SESSION SUMMARY
Course Elapsed Days: 22
Plan Fractions Treated to Date: 15
Plan Prescribed Dose Per Fraction: 2 Gy
Plan Total Fractions Prescribed: 33
Plan Total Prescribed Dose: 66 Gy
Reference Point Dosage Given to Date: 30 Gy
Reference Point Session Dosage Given: 2 Gy
Session Number: 15

## 2024-11-02 ENCOUNTER — Other Ambulatory Visit: Payer: Self-pay

## 2024-11-02 ENCOUNTER — Ambulatory Visit: Admission: RE | Admit: 2024-11-02 | Discharge: 2024-11-02 | Attending: Radiation Oncology

## 2024-11-02 DIAGNOSIS — Z510 Encounter for antineoplastic radiation therapy: Secondary | ICD-10-CM | POA: Diagnosis not present

## 2024-11-02 LAB — RAD ONC ARIA SESSION SUMMARY
Course Elapsed Days: 23
Plan Fractions Treated to Date: 16
Plan Prescribed Dose Per Fraction: 2 Gy
Plan Total Fractions Prescribed: 33
Plan Total Prescribed Dose: 66 Gy
Reference Point Dosage Given to Date: 32 Gy
Reference Point Session Dosage Given: 2 Gy
Session Number: 16

## 2024-11-03 ENCOUNTER — Other Ambulatory Visit: Payer: Self-pay

## 2024-11-03 ENCOUNTER — Ambulatory Visit

## 2024-11-03 DIAGNOSIS — Z510 Encounter for antineoplastic radiation therapy: Secondary | ICD-10-CM | POA: Diagnosis not present

## 2024-11-03 LAB — RAD ONC ARIA SESSION SUMMARY
Course Elapsed Days: 24
Plan Fractions Treated to Date: 17
Plan Prescribed Dose Per Fraction: 2 Gy
Plan Total Fractions Prescribed: 33
Plan Total Prescribed Dose: 66 Gy
Reference Point Dosage Given to Date: 34 Gy
Reference Point Session Dosage Given: 2 Gy
Session Number: 17

## 2024-11-04 ENCOUNTER — Other Ambulatory Visit: Payer: Self-pay

## 2024-11-04 ENCOUNTER — Ambulatory Visit: Admission: RE | Admit: 2024-11-04 | Discharge: 2024-11-04 | Attending: Radiation Oncology

## 2024-11-04 DIAGNOSIS — Z510 Encounter for antineoplastic radiation therapy: Secondary | ICD-10-CM | POA: Diagnosis not present

## 2024-11-04 LAB — RAD ONC ARIA SESSION SUMMARY
Course Elapsed Days: 25
Plan Fractions Treated to Date: 18
Plan Prescribed Dose Per Fraction: 2 Gy
Plan Total Fractions Prescribed: 33
Plan Total Prescribed Dose: 66 Gy
Reference Point Dosage Given to Date: 36 Gy
Reference Point Session Dosage Given: 2 Gy
Session Number: 18

## 2024-11-06 ENCOUNTER — Ambulatory Visit: Admission: RE | Admit: 2024-11-06 | Discharge: 2024-11-06 | Attending: Radiation Oncology

## 2024-11-06 ENCOUNTER — Other Ambulatory Visit: Payer: Self-pay

## 2024-11-06 DIAGNOSIS — Z510 Encounter for antineoplastic radiation therapy: Secondary | ICD-10-CM | POA: Diagnosis not present

## 2024-11-06 LAB — RAD ONC ARIA SESSION SUMMARY
Course Elapsed Days: 27
Plan Fractions Treated to Date: 19
Plan Prescribed Dose Per Fraction: 2 Gy
Plan Total Fractions Prescribed: 33
Plan Total Prescribed Dose: 66 Gy
Reference Point Dosage Given to Date: 38 Gy
Reference Point Session Dosage Given: 2 Gy
Session Number: 19

## 2024-11-07 ENCOUNTER — Other Ambulatory Visit: Payer: Self-pay

## 2024-11-07 ENCOUNTER — Ambulatory Visit: Admission: RE | Admit: 2024-11-07 | Discharge: 2024-11-07 | Attending: Radiation Oncology

## 2024-11-07 DIAGNOSIS — Z510 Encounter for antineoplastic radiation therapy: Secondary | ICD-10-CM | POA: Diagnosis not present

## 2024-11-07 LAB — RAD ONC ARIA SESSION SUMMARY
Course Elapsed Days: 28
Plan Fractions Treated to Date: 20
Plan Prescribed Dose Per Fraction: 2 Gy
Plan Total Fractions Prescribed: 33
Plan Total Prescribed Dose: 66 Gy
Reference Point Dosage Given to Date: 40 Gy
Reference Point Session Dosage Given: 2 Gy
Session Number: 20

## 2024-11-08 ENCOUNTER — Other Ambulatory Visit: Payer: Self-pay

## 2024-11-08 ENCOUNTER — Ambulatory Visit
Admission: RE | Admit: 2024-11-08 | Discharge: 2024-11-08 | Disposition: A | Discharge status: Home / Self Care | Source: Ambulatory Visit | Attending: Radiation Oncology | Admitting: Radiation Oncology

## 2024-11-08 ENCOUNTER — Ambulatory Visit: Admitting: Radiation Oncology

## 2024-11-08 DIAGNOSIS — Z510 Encounter for antineoplastic radiation therapy: Secondary | ICD-10-CM | POA: Diagnosis not present

## 2024-11-08 LAB — RAD ONC ARIA SESSION SUMMARY
Course Elapsed Days: 29
Plan Fractions Treated to Date: 21
Plan Prescribed Dose Per Fraction: 2 Gy
Plan Total Fractions Prescribed: 33
Plan Total Prescribed Dose: 66 Gy
Reference Point Dosage Given to Date: 42 Gy
Reference Point Session Dosage Given: 2 Gy
Session Number: 21

## 2024-11-09 ENCOUNTER — Other Ambulatory Visit: Payer: Self-pay

## 2024-11-09 DIAGNOSIS — Z510 Encounter for antineoplastic radiation therapy: Secondary | ICD-10-CM | POA: Diagnosis not present

## 2024-11-09 LAB — RAD ONC ARIA SESSION SUMMARY
Course Elapsed Days: 30
Plan Fractions Treated to Date: 22
Plan Prescribed Dose Per Fraction: 2 Gy
Plan Total Fractions Prescribed: 33
Plan Total Prescribed Dose: 66 Gy
Reference Point Dosage Given to Date: 44 Gy
Reference Point Session Dosage Given: 2 Gy
Session Number: 22

## 2024-11-14 ENCOUNTER — Other Ambulatory Visit: Payer: Self-pay

## 2024-11-14 ENCOUNTER — Ambulatory Visit
Admission: RE | Admit: 2024-11-14 | Discharge: 2024-11-14 | Disposition: A | Discharge status: Home / Self Care | Source: Ambulatory Visit | Attending: Radiation Oncology | Admitting: Radiation Oncology

## 2024-11-14 DIAGNOSIS — Z510 Encounter for antineoplastic radiation therapy: Secondary | ICD-10-CM | POA: Diagnosis not present

## 2024-11-14 LAB — RAD ONC ARIA SESSION SUMMARY
Course Elapsed Days: 35
Plan Fractions Treated to Date: 23
Plan Prescribed Dose Per Fraction: 2 Gy
Plan Total Fractions Prescribed: 33
Plan Total Prescribed Dose: 66 Gy
Reference Point Dosage Given to Date: 46 Gy
Reference Point Session Dosage Given: 2 Gy
Session Number: 23

## 2024-11-15 ENCOUNTER — Ambulatory Visit: Admitting: Radiation Oncology

## 2024-11-15 ENCOUNTER — Other Ambulatory Visit: Payer: Self-pay

## 2024-11-15 ENCOUNTER — Ambulatory Visit
Admission: RE | Admit: 2024-11-15 | Discharge: 2024-11-15 | Disposition: A | Discharge status: Home / Self Care | Source: Ambulatory Visit | Attending: Radiation Oncology | Admitting: Radiation Oncology

## 2024-11-15 DIAGNOSIS — Z510 Encounter for antineoplastic radiation therapy: Secondary | ICD-10-CM | POA: Diagnosis not present

## 2024-11-15 LAB — RAD ONC ARIA SESSION SUMMARY
Course Elapsed Days: 36
Plan Fractions Treated to Date: 24
Plan Prescribed Dose Per Fraction: 2 Gy
Plan Total Fractions Prescribed: 33
Plan Total Prescribed Dose: 66 Gy
Reference Point Dosage Given to Date: 48 Gy
Reference Point Session Dosage Given: 2 Gy
Session Number: 24

## 2024-11-16 ENCOUNTER — Ambulatory Visit: Admitting: Radiation Oncology

## 2024-11-16 ENCOUNTER — Other Ambulatory Visit: Payer: Self-pay

## 2024-11-16 ENCOUNTER — Ambulatory Visit
Admission: RE | Admit: 2024-11-16 | Discharge: 2024-11-16 | Disposition: A | Discharge status: Home / Self Care | Source: Ambulatory Visit | Attending: Radiation Oncology | Admitting: Radiation Oncology

## 2024-11-16 DIAGNOSIS — Z510 Encounter for antineoplastic radiation therapy: Secondary | ICD-10-CM | POA: Diagnosis not present

## 2024-11-16 LAB — RAD ONC ARIA SESSION SUMMARY
Course Elapsed Days: 37
Plan Fractions Treated to Date: 25
Plan Prescribed Dose Per Fraction: 2 Gy
Plan Total Fractions Prescribed: 33
Plan Total Prescribed Dose: 66 Gy
Reference Point Dosage Given to Date: 50 Gy
Reference Point Session Dosage Given: 2 Gy
Session Number: 25

## 2024-11-18 ENCOUNTER — Other Ambulatory Visit: Payer: Self-pay

## 2024-11-18 ENCOUNTER — Ambulatory Visit
Admission: RE | Admit: 2024-11-18 | Discharge: 2024-11-18 | Disposition: A | Discharge status: Home / Self Care | Source: Ambulatory Visit | Attending: Radiation Oncology | Admitting: Radiation Oncology

## 2024-11-18 DIAGNOSIS — Z510 Encounter for antineoplastic radiation therapy: Secondary | ICD-10-CM | POA: Insufficient documentation

## 2024-11-18 DIAGNOSIS — C61 Malignant neoplasm of prostate: Secondary | ICD-10-CM | POA: Diagnosis not present

## 2024-11-18 LAB — RAD ONC ARIA SESSION SUMMARY
Course Elapsed Days: 39
Plan Fractions Treated to Date: 26
Plan Prescribed Dose Per Fraction: 2 Gy
Plan Total Fractions Prescribed: 33
Plan Total Prescribed Dose: 66 Gy
Reference Point Dosage Given to Date: 52 Gy
Reference Point Session Dosage Given: 2 Gy
Session Number: 26

## 2024-11-21 ENCOUNTER — Ambulatory Visit
Admission: RE | Admit: 2024-11-21 | Discharge: 2024-11-21 | Disposition: A | Discharge status: Home / Self Care | Source: Ambulatory Visit | Attending: Radiation Oncology | Admitting: Radiation Oncology

## 2024-11-21 ENCOUNTER — Other Ambulatory Visit: Payer: Self-pay

## 2024-11-21 DIAGNOSIS — Z510 Encounter for antineoplastic radiation therapy: Secondary | ICD-10-CM | POA: Diagnosis not present

## 2024-11-21 LAB — RAD ONC ARIA SESSION SUMMARY
Course Elapsed Days: 42
Plan Fractions Treated to Date: 27
Plan Prescribed Dose Per Fraction: 2 Gy
Plan Total Fractions Prescribed: 33
Plan Total Prescribed Dose: 66 Gy
Reference Point Dosage Given to Date: 54 Gy
Reference Point Session Dosage Given: 2 Gy
Session Number: 27

## 2024-11-22 ENCOUNTER — Ambulatory Visit
Admission: RE | Admit: 2024-11-22 | Discharge: 2024-11-22 | Disposition: A | Source: Ambulatory Visit | Attending: Radiation Oncology

## 2024-11-22 ENCOUNTER — Ambulatory Visit
Admission: RE | Admit: 2024-11-22 | Discharge: 2024-11-22 | Disposition: A | Source: Ambulatory Visit | Attending: Radiation Oncology | Admitting: Radiation Oncology

## 2024-11-22 ENCOUNTER — Other Ambulatory Visit: Payer: Self-pay

## 2024-11-22 DIAGNOSIS — Z510 Encounter for antineoplastic radiation therapy: Secondary | ICD-10-CM | POA: Diagnosis not present

## 2024-11-22 LAB — RAD ONC ARIA SESSION SUMMARY
Course Elapsed Days: 43
Plan Fractions Treated to Date: 28
Plan Prescribed Dose Per Fraction: 2 Gy
Plan Total Fractions Prescribed: 33
Plan Total Prescribed Dose: 66 Gy
Reference Point Dosage Given to Date: 56 Gy
Reference Point Session Dosage Given: 2 Gy
Session Number: 28

## 2024-11-23 ENCOUNTER — Ambulatory Visit
Admission: RE | Admit: 2024-11-23 | Discharge: 2024-11-23 | Disposition: A | Discharge status: Home / Self Care | Source: Ambulatory Visit | Attending: Radiation Oncology | Admitting: Radiation Oncology

## 2024-11-23 ENCOUNTER — Other Ambulatory Visit: Payer: Self-pay

## 2024-11-23 DIAGNOSIS — Z510 Encounter for antineoplastic radiation therapy: Secondary | ICD-10-CM | POA: Diagnosis not present

## 2024-11-23 LAB — RAD ONC ARIA SESSION SUMMARY
Course Elapsed Days: 44
Plan Fractions Treated to Date: 29
Plan Prescribed Dose Per Fraction: 2 Gy
Plan Total Fractions Prescribed: 33
Plan Total Prescribed Dose: 66 Gy
Reference Point Dosage Given to Date: 58 Gy
Reference Point Session Dosage Given: 2 Gy
Session Number: 29

## 2024-11-24 ENCOUNTER — Ambulatory Visit
Admission: RE | Admit: 2024-11-24 | Discharge: 2024-11-24 | Disposition: A | Discharge status: Home / Self Care | Source: Ambulatory Visit | Attending: Radiation Oncology | Admitting: Radiation Oncology

## 2024-11-24 ENCOUNTER — Other Ambulatory Visit: Payer: Self-pay

## 2024-11-24 DIAGNOSIS — Z510 Encounter for antineoplastic radiation therapy: Secondary | ICD-10-CM | POA: Diagnosis not present

## 2024-11-24 LAB — RAD ONC ARIA SESSION SUMMARY
Course Elapsed Days: 45
Plan Fractions Treated to Date: 30
Plan Prescribed Dose Per Fraction: 2 Gy
Plan Total Fractions Prescribed: 33
Plan Total Prescribed Dose: 66 Gy
Reference Point Dosage Given to Date: 60 Gy
Reference Point Session Dosage Given: 2 Gy
Session Number: 30

## 2024-11-25 ENCOUNTER — Ambulatory Visit
Admission: RE | Admit: 2024-11-25 | Discharge: 2024-11-25 | Disposition: A | Source: Ambulatory Visit | Attending: Radiation Oncology

## 2024-11-25 ENCOUNTER — Other Ambulatory Visit: Payer: Self-pay

## 2024-11-25 DIAGNOSIS — Z510 Encounter for antineoplastic radiation therapy: Secondary | ICD-10-CM | POA: Diagnosis not present

## 2024-11-25 LAB — RAD ONC ARIA SESSION SUMMARY
Course Elapsed Days: 46
Plan Fractions Treated to Date: 31
Plan Prescribed Dose Per Fraction: 2 Gy
Plan Total Fractions Prescribed: 33
Plan Total Prescribed Dose: 66 Gy
Reference Point Dosage Given to Date: 62 Gy
Reference Point Session Dosage Given: 2 Gy
Session Number: 31

## 2024-11-28 ENCOUNTER — Ambulatory Visit
Admission: RE | Admit: 2024-11-28 | Discharge: 2024-11-28 | Disposition: A | Source: Ambulatory Visit | Attending: Radiation Oncology

## 2024-11-28 ENCOUNTER — Other Ambulatory Visit: Payer: Self-pay

## 2024-11-28 DIAGNOSIS — Z510 Encounter for antineoplastic radiation therapy: Secondary | ICD-10-CM | POA: Diagnosis not present

## 2024-11-28 LAB — RAD ONC ARIA SESSION SUMMARY
Course Elapsed Days: 49
Plan Fractions Treated to Date: 32
Plan Prescribed Dose Per Fraction: 2 Gy
Plan Total Fractions Prescribed: 33
Plan Total Prescribed Dose: 66 Gy
Reference Point Dosage Given to Date: 64 Gy
Reference Point Session Dosage Given: 2 Gy
Session Number: 32

## 2024-11-29 ENCOUNTER — Ambulatory Visit

## 2024-11-29 ENCOUNTER — Other Ambulatory Visit: Payer: Self-pay

## 2024-11-29 ENCOUNTER — Ambulatory Visit
Admission: RE | Admit: 2024-11-29 | Discharge: 2024-11-29 | Disposition: A | Source: Ambulatory Visit | Attending: Radiation Oncology | Admitting: Radiation Oncology

## 2024-11-29 ENCOUNTER — Ambulatory Visit
Admission: RE | Admit: 2024-11-29 | Discharge: 2024-11-29 | Disposition: A | Source: Ambulatory Visit | Attending: Radiation Oncology

## 2024-11-29 DIAGNOSIS — Z510 Encounter for antineoplastic radiation therapy: Secondary | ICD-10-CM | POA: Diagnosis not present

## 2024-11-29 LAB — RAD ONC ARIA SESSION SUMMARY
Course Elapsed Days: 50
Plan Fractions Treated to Date: 33
Plan Prescribed Dose Per Fraction: 2 Gy
Plan Total Fractions Prescribed: 33
Plan Total Prescribed Dose: 66 Gy
Reference Point Dosage Given to Date: 66 Gy
Reference Point Session Dosage Given: 2 Gy
Session Number: 33

## 2024-11-30 ENCOUNTER — Ambulatory Visit

## 2024-12-01 NOTE — Radiation Completion Notes (Signed)
 Patient Name: Neil Weber, Neil Weber MRN: [id number] Date of Birth: 23-Oct-1956 Referring Physician: GERMAIN BROTHERS, M.D. Date of Service: 2024-12-01 Radiation Oncologist: Lynwood Cedar, M.D. [facility]                             RADIATION ONCOLOGY END OF TREATMENT NOTE     Diagnosis: C61 Malignant neoplasm of prostate Intent: Curative     ==========DELIVERED PLANS==========  First Treatment Date: 2024-10-10 Last Treatment Date: 2024-11-29   Plan Name: ProstBed Site: Prostate Bed Technique: IMRT Mode: Photon Dose Per Fraction: 2 Gy Prescribed Dose (Delivered / Prescribed): 66 Gy / 66 Gy Prescribed Fxs (Delivered / Prescribed): 33 / 33     ==========ON TREATMENT VISIT DATES========== 2024-10-11, 2024-10-18, 2024-10-25, 2024-11-01, 2024-11-08, 2024-11-15, 2024-11-22, 2024-11-29     ==========UPCOMING VISITS========== 01/12/2025 [facility] RAD ONC FOLLOW UP 20 Cedar Lynwood, MD        ==========APPENDIX - ON TREATMENT VISIT NOTES==========   See weekly On Treatment Notes in Epic for details in the Media tab (listed as Progress notes on the On Treatment Visit Dates listed above).

## 2025-01-12 ENCOUNTER — Ambulatory Visit: Admitting: Radiation Oncology
